# Patient Record
Sex: Female | Born: 1990
Health system: Southern US, Community
[De-identification: ages and names within clinical notes are randomized; demographics above are authoritative.]

## PROBLEM LIST (undated history)

## (undated) DIAGNOSIS — R112 Nausea with vomiting, unspecified: Secondary | ICD-10-CM

## (undated) DIAGNOSIS — R011 Cardiac murmur, unspecified: Secondary | ICD-10-CM

## (undated) DIAGNOSIS — J189 Pneumonia, unspecified organism: Secondary | ICD-10-CM

## (undated) DIAGNOSIS — J45909 Unspecified asthma, uncomplicated: Secondary | ICD-10-CM

## (undated) DIAGNOSIS — Z9889 Other specified postprocedural states: Secondary | ICD-10-CM

## (undated) DIAGNOSIS — N83209 Unspecified ovarian cyst, unspecified side: Secondary | ICD-10-CM

## (undated) HISTORY — DX: Cardiac murmur, unspecified: R01.1

## (undated) HISTORY — PX: APPENDECTOMY: SHX54

---

## 2016-03-01 ENCOUNTER — Ambulatory Visit (INDEPENDENT_AMBULATORY_CARE_PROVIDER_SITE_OTHER): Payer: 59 | Admitting: Family Medicine

## 2016-03-01 ENCOUNTER — Encounter: Payer: Self-pay | Admitting: Family Medicine

## 2016-03-01 VITALS — BP 140/90 | HR 72 | Temp 98.6°F | Ht 62.5 in | Wt 123.1 lb

## 2016-03-01 DIAGNOSIS — Z0001 Encounter for general adult medical examination with abnormal findings: Secondary | ICD-10-CM | POA: Diagnosis not present

## 2016-03-01 DIAGNOSIS — L659 Nonscarring hair loss, unspecified: Secondary | ICD-10-CM | POA: Diagnosis not present

## 2016-03-01 LAB — POC URINALSYSI DIPSTICK (AUTOMATED)
BILIRUBIN UA: NEGATIVE
GLUCOSE UA: NEGATIVE
Leukocytes, UA: NEGATIVE
Nitrite, UA: NEGATIVE
Protein, UA: NEGATIVE
RBC UA: NEGATIVE
SPEC GRAV UA: 1.02
Urobilinogen, UA: 0.2
pH, UA: 5.5

## 2016-03-01 NOTE — Patient Instructions (Signed)
It was a pleasure meeting you today! Please obtain prior medical records at your convenience as we discussed. Also, follow up will be determined after lab results are reviewed.  Health Maintenance, Female Adopting a healthy lifestyle and getting preventive care can go a long way to promote health and wellness. Talk with your health care provider about what schedule of regular examinations is right for you. This is a good chance for you to check in with your provider about disease prevention and staying healthy. In between checkups, there are plenty of things you can do on your own. Experts have done a lot of research about which lifestyle changes and preventive measures are most likely to keep you healthy. Ask your health care provider for more information. WEIGHT AND DIET  Eat a healthy diet  Be sure to include plenty of vegetables, fruits, low-fat dairy products, and lean protein.  Do not eat a lot of foods high in solid fats, added sugars, or salt.  Get regular exercise. This is one of the most important things you can do for your health.  Most adults should exercise for at least 150 minutes each week. The exercise should increase your heart rate and make you sweat (moderate-intensity exercise).  Most adults should also do strengthening exercises at least twice a week. This is in addition to the moderate-intensity exercise.  Maintain a healthy weight  Body mass index (BMI) is a measurement that can be used to identify possible weight problems. It estimates body fat based on height and weight. Your health care provider can help determine your BMI and help you achieve or maintain a healthy weight.  For females 11 years of age and older:   A BMI below 18.5 is considered underweight.  A BMI of 18.5 to 24.9 is normal.  A BMI of 25 to 29.9 is considered overweight.  A BMI of 30 and above is considered obese.  Watch levels of cholesterol and blood lipids  You should start having your  blood tested for lipids and cholesterol at 27 years of age, then have this test every 5 years.  You may need to have your cholesterol levels checked more often if:  Your lipid or cholesterol levels are high.  You are older than 27 years of age.  You are at high risk for heart disease.  CANCER SCREENING   Lung Cancer  Lung cancer screening is recommended for adults 69-47 years old who are at high risk for lung cancer because of a history of smoking.  A yearly low-dose CT scan of the lungs is recommended for people who:  Currently smoke.  Have quit within the past 15 years.  Have at least a 30-pack-year history of smoking. A pack year is smoking an average of one pack of cigarettes a day for 1 year.  Yearly screening should continue until it has been 15 years since you quit.  Yearly screening should stop if you develop a health problem that would prevent you from having lung cancer treatment.  Breast Cancer  Practice breast self-awareness. This means understanding how your breasts normally appear and feel.  It also means doing regular breast self-exams. Let your health care provider know about any changes, no matter how small.  If you are in your 20s or 30s, you should have a clinical breast exam (CBE) by a health care provider every 1-3 years as part of a regular health exam.  If you are 70 or older, have a CBE every year. Also  consider having a breast X-ray (mammogram) every year.  If you have a family history of breast cancer, talk to your health care provider about genetic screening.  If you are at high risk for breast cancer, talk to your health care provider about having an MRI and a mammogram every year.  Breast cancer gene (BRCA) assessment is recommended for women who have family members with BRCA-related cancers. BRCA-related cancers include:  Breast.  Ovarian.  Tubal.  Peritoneal cancers.  Results of the assessment will determine the need for genetic  counseling and BRCA1 and BRCA2 testing. Cervical Cancer Your health care provider may recommend that you be screened regularly for cancer of the pelvic organs (ovaries, uterus, and vagina). This screening involves a pelvic examination, including checking for microscopic changes to the surface of your cervix (Pap test). You may be encouraged to have this screening done every 3 years, beginning at age 85.  For women ages 66-65, health care providers may recommend pelvic exams and Pap testing every 3 years, or they may recommend the Pap and pelvic exam, combined with testing for human papilloma virus (HPV), every 5 years. Some types of HPV increase your risk of cervical cancer. Testing for HPV may also be done on women of any age with unclear Pap test results.  Other health care providers may not recommend any screening for nonpregnant women who are considered low risk for pelvic cancer and who do not have symptoms. Ask your health care provider if a screening pelvic exam is right for you.  If you have had past treatment for cervical cancer or a condition that could lead to cancer, you need Pap tests and screening for cancer for at least 20 years after your treatment. If Pap tests have been discontinued, your risk factors (such as having a new sexual partner) need to be reassessed to determine if screening should resume. Some women have medical problems that increase the chance of getting cervical cancer. In these cases, your health care provider may recommend more frequent screening and Pap tests. Colorectal Cancer  This type of cancer can be detected and often prevented.  Routine colorectal cancer screening usually begins at 27 years of age and continues through 27 years of age.  Your health care provider may recommend screening at an earlier age if you have risk factors for colon cancer.  Your health care provider may also recommend using home test kits to check for hidden blood in the stool.  A  small camera at the end of a tube can be used to examine your colon directly (sigmoidoscopy or colonoscopy). This is done to check for the earliest forms of colorectal cancer.  Routine screening usually begins at age 41.  Direct examination of the colon should be repeated every 5-10 years through 27 years of age. However, you may need to be screened more often if early forms of precancerous polyps or small growths are found. Skin Cancer  Check your skin from head to toe regularly.  Tell your health care provider about any new moles or changes in moles, especially if there is a change in a mole's shape or color.  Also tell your health care provider if you have a mole that is larger than the size of a pencil eraser.  Always use sunscreen. Apply sunscreen liberally and repeatedly throughout the day.  Protect yourself by wearing long sleeves, pants, a wide-brimmed hat, and sunglasses whenever you are outside. HEART DISEASE, DIABETES, AND HIGH BLOOD PRESSURE   High  blood pressure causes heart disease and increases the risk of stroke. High blood pressure is more likely to develop in:  People who have blood pressure in the high end of the normal range (130-139/85-89 mm Hg).  People who are overweight or obese.  People who are African American.  If you are 46-75 years of age, have your blood pressure checked every 3-5 years. If you are 37 years of age or older, have your blood pressure checked every year. You should have your blood pressure measured twice--once when you are at a hospital or clinic, and once when you are not at a hospital or clinic. Record the average of the two measurements. To check your blood pressure when you are not at a hospital or clinic, you can use:  An automated blood pressure machine at a pharmacy.  A home blood pressure monitor.  If you are between 54 years and 18 years old, ask your health care provider if you should take aspirin to prevent strokes.  Have  regular diabetes screenings. This involves taking a blood sample to check your fasting blood sugar level.  If you are at a normal weight and have a low risk for diabetes, have this test once every three years after 27 years of age.  If you are overweight and have a high risk for diabetes, consider being tested at a younger age or more often. PREVENTING INFECTION  Hepatitis B  If you have a higher risk for hepatitis B, you should be screened for this virus. You are considered at high risk for hepatitis B if:  You were born in a country where hepatitis B is common. Ask your health care provider which countries are considered high risk.  Your parents were born in a high-risk country, and you have not been immunized against hepatitis B (hepatitis B vaccine).  You have HIV or AIDS.  You use needles to inject street drugs.  You live with someone who has hepatitis B.  You have had sex with someone who has hepatitis B.  You get hemodialysis treatment.  You take certain medicines for conditions, including cancer, organ transplantation, and autoimmune conditions. Hepatitis C  Blood testing is recommended for:  Everyone born from [birth year] through 1967.  Anyone with known risk factors for hepatitis C. Sexually transmitted infections (STIs)  You should be screened for sexually transmitted infections (STIs) including gonorrhea and chlamydia if:  You are sexually active and are younger than 27 years of age.  You are older than 27 years of age and your health care provider tells you that you are at risk for this type of infection.  Your sexual activity has changed since you were last screened and you are at an increased risk for chlamydia or gonorrhea. Ask your health care provider if you are at risk.  If you do not have HIV, but are at risk, it may be recommended that you take a prescription medicine daily to prevent HIV infection. This is called pre-exposure prophylaxis (PrEP). You are  considered at risk if:  You are sexually active and do not regularly use condoms or know the HIV status of your partner(s).  You take drugs by injection.  You are sexually active with a partner who has HIV. Talk with your health care provider about whether you are at high risk of being infected with HIV. If you choose to begin PrEP, you should first be tested for HIV. You should then be tested every 3 months for as long  as you are taking PrEP.  PREGNANCY   If you are premenopausal and you may become pregnant, ask your health care provider about preconception counseling.  If you may become pregnant, take 400 to 800 micrograms (mcg) of folic acid every day.  If you want to prevent pregnancy, talk to your health care provider about birth control (contraception). OSTEOPOROSIS AND MENOPAUSE   Osteoporosis is a disease in which the bones lose minerals and strength with aging. This can result in serious bone fractures. Your risk for osteoporosis can be identified using a bone density scan.  If you are 84 years of age or older, or if you are at risk for osteoporosis and fractures, ask your health care provider if you should be screened.  Ask your health care provider whether you should take a calcium or vitamin D supplement to lower your risk for osteoporosis.  Menopause may have certain physical symptoms and risks.  Hormone replacement therapy may reduce some of these symptoms and risks. Talk to your health care provider about whether hormone replacement therapy is right for you.  HOME CARE INSTRUCTIONS   Schedule regular health, dental, and eye exams.  Stay current with your immunizations.   Do not use any tobacco products including cigarettes, chewing tobacco, or electronic cigarettes.  If you are pregnant, do not drink alcohol.  If you are breastfeeding, limit how much and how often you drink alcohol.  Limit alcohol intake to no more than 1 drink per day for nonpregnant women. One  drink equals 12 ounces of beer, 5 ounces of wine, or 1 ounces of hard liquor.  Do not use street drugs.  Do not share needles.  Ask your health care provider for help if you need support or information about quitting drugs.  Tell your health care provider if you often feel depressed.  Tell your health care provider if you have ever been abused or do not feel safe at home.   This information is not intended to replace advice given to you by your health care provider. Make sure you discuss any questions you have with your health care provider.   Document Released: 12/21/2010 Document Revised: 06/28/2014 Document Reviewed: 05/09/2013 Elsevier Interactive Patient Education [employer].

## 2016-03-01 NOTE — Progress Notes (Signed)
Pre visit review using our clinic review tool, if applicable. No additional management support is needed unless otherwise documented below in the visit note. 

## 2016-03-01 NOTE — Progress Notes (Addendum)
Patient ID: Lori Miranda, female   DOB: [birth year], 25 y.o.   MRN: [id number]  Patient presents to clinic today to establish care and for routine wellness care. She is a nonsmoker and is currently UTD with her gynecological care. She recently relocated here from [facility]. Last gynecology visit 07/2015 was normal.  Diet consists of lean meats and vegetables. Water intake about 3 glasses. One cup of coffee in the morning.   Exercise: Walks at work and walks dog at home every night.    Hair thinning:  Patient has been seen by dermatology for hair thinning that has been present since 2012. She reports that she will obtain records from [facility] for review as she is currently not on any medications for this purpose. She reports that she is not experiencing "hair loss" but associated thinning of hair with prior birth control therapies.She discontinued depo provera as she was concerned this may have contributed to her hair loss and she denies any additional thinning of hair after stopping depo provera. Although she does not report any additional hair loss; she notes that her hair is thin. Mother has thin hair also and patient is unsure when her thinning started.  She also is UTD with her gynecology care and as of 07/2015 stated that her testosterone was normal and DHEA was mildly elevated. She denies any pain, tenderness, pruritus, burning, new medications, hair products, or change in diet.  She is experiencing regular menstrual cycles.   Health Maintenance: Dental --Every 6 months Vision -- Yearly; wears contacts; UTD Immunizations --Will obtain at work; declined in office today Colonoscopy --Not needed Mammogram --Not needed PAP -- 07/2015 Bone Density -- Not needed  Past Medical History:  Diagnosis Date  . Heart murmur      Social History   Social History  . Marital status: Single    Spouse name: N/A  . Number of children: N/A  . Years of education: N/A   Occupational History  . Not on file.    Social History Main Topics  . Smoking status: Never Smoker  . Smokeless tobacco: Never Used  . Alcohol use 2.4 oz/week    4 Cans of beer per week  . Drug use: No  . Sexual activity: Yes    Birth control/ protection: Condom   Other Topics Concern  . Not on file   Social History Narrative  . No narrative on file    Past Surgical History:  Procedure Laterality Date  . APPENDECTOMY      Family History  Problem Relation Age of Onset  . Hypertension Mother   . Diabetes Mother     Allergies not on file  No current outpatient prescriptions on file prior to visit.   No current facility-administered medications on file prior to visit.     BP 140/90 (BP Location: Right Arm, Patient Position: Sitting, Cuff Size: Normal)   Pulse 72   Temp 98.6 F (37 C) (Oral)   Ht 5' 2.5" (1.588 m)   Wt 123 lb 1.6 oz (55.8 kg)   LMP 02/01/2016 (Exact Date)   SpO2 99%   BMI 22.16 kg/m     ROS  Pulse 72   Temp 98.6 F (37 C) (Oral)   Ht 5' 2.5" (1.588 m)   Wt 123 lb 1.6 oz (55.8 kg)   LMP 02/01/2016 (Exact Date)   SpO2 99%   BMI 22.16 kg/m  Constitutional: No fever, chills, significant weight change, fatigue, weakness or night sweats Eyes: No redness,  discharge, pain, blurred vision, double vision, or loss of vision ENT/mouth: No nasal congestion, postnasal drainage,epistaxis, purulent discharge, earache, hearing loss, tinnitus ,sore throat , dental pain, or hoarseness   Cardiovascular: no chest pain, palpitations, racing, irregular rhythm, syncope, nausea, sweating, claudication, or edema  Respiratory: No cough, sputum production,hemoptysis,  dyspnea, paroxysmal nocturnal dyspnea, pleuritic chest pain, significant snoring, or  apnea    Gastrointestinal: No heartburn,dysphagia, nausea and vomiting,ominal pain, change in bowels, anorexia, diarrhea, significant constipation, rectal bleeding, melena,  stool incontinence or jaundice Genitourinary: No dysuria,hematuria, pyuria,  frequency, urgency,  incontinence, nocturia, dark urine or flank pain Musculoskeletal: No myalgias or muscle cramping, joint stiffness, joint swelling, joint color change, weakness, or cyanosis Dermatologic: No rash, pruritus, urticaria, or change in color or temperature of skin.  Neurologic: No headache, vertigo, limb weakness, tremor, gait disturbance, seizures, memory loss, numbness or tingling Psychiatric: No significant anxiety or depression, anhedonia, panic attacks, insomnia, or anorexia Endocrine: No change in skin/ nails, excessive thirst, excessive hunger, excessive urination, or unexplained fatigue. Hair thinning Hematologic/lymphatic: No bruising, lymphadenopathy,or  abnormal clotting Allergy/immunology: No itchy/ watery eyes, abnormal sneezing, rhinitis, urticaria ,or angioedema  Physical Exam Physical Exam  Constitutional: She is oriented to person, place, and time. She appears well-developed and well-nourished. No distress.  HENT:  Head: Normocephalic and atraumatic.  Right Ear: Tympanic membrane and ear canal normal.  Left Ear: Tympanic membrane and ear canal normal.  Mouth/Throat: Oropharynx is clear and moist.  Eyes: Pupils are equal, round, and reactive to light. No scleral icterus.  Neck: Normal range of motion. No thyromegaly present.  Cardiovascular: Normal rate and regular rhythm.   No murmur heard. Pulmonary/Chest: Effort normal and breath sounds normal. No respiratory distress. He has no wheezes. She has no rales. She exhibits no tenderness.  Abdominal: Soft. Bowel sounds are normal. She exhibits no distension and no mass. There is no tenderness. There is no rebound and no guarding.  Musculoskeletal: She exhibits no edema.  Lymphadenopathy:    She has no cervical adenopathy.  Neurological: She is alert and oriented to person, place, and time. She has normal patellar reflexes. She exhibits normal muscle tone. Coordination normal.  Skin: Skin is warm and dry.  Negative hair pull test. Hair is distributed evenly and she is wearing her hair in a ponytail.  Psychiatric: She has a normal mood and affect. Her behavior is normal. Judgment and thought content normal.  Gynecological exam:  Deferred. She  is UTD and reports PAP and breast exam normal; She will request records from [facility].     Assessment/Plan:  25 y.o. female presenting for annual physical.  Health Maintenance counseling: 1. Anticipatory guidance: Patient counseled regarding regular dental exams, eye exams, wearing seatbelts.  2. Risk factor reduction:  Advised patient of need for regular exercise and diet rich and fruits and vegetables to reduce risk of heart attack and stroke.  3. Immunizations/screenings/ancillary studies. She declined influenza vaccine today. She will obtain in her workplace next week. Release of medical records signed today to obtain UTD health screening documentation.  Health Maintenance Due  Topic Date Due  . HIV Screening  07/20/2003  . TETANUS/TDAP  07/20/2007  . PAP SMEAR  07/19/2009  . INFLUENZA VACCINE  01/20/2016   4. Cervical cancer screening- UTD; Completed 07/2015; release for records completed today. 5. Breast cancer screening-  Completed 07/2015; release for records completed today.  1. Encounter for general adult medical examination with abnormal findings Overall well exam. Will obtain lab work with the  exception of Lipid level as patient is not fasting today. She is requesting to obtain lab work today and will follow up for a lipid level at a later time when she is fasting. Recommended regular exercise regimen with 150 minutes/week as a goal.    - CBC with Differential/Platelet - Basic metabolic panel - Hepatic function panel - Urinalysis  2. Hair thinning Patient history of hair thinning with Depo provera and prior hormonal studies may indicate that thinning of hair is an adverse effect of Depo. Obtain lab work today and patient stated she will obtain  gynecological records for review. Negative hair pull test and report that she is not aware of further hair loss, and family history of thinning hair are noted. After review of records and if symptom continues to occur, will consider obtaining lab work for [facility] and testosterone levels or will consider referral to endocrinology.  - TSH  Roddie Mc, FNP-C

## 2016-03-01 NOTE — Addendum Note (Signed)
Addended by: Baldwin CrownJOHNSON, Chancelor Hardrick D on: 03/01/2016 05:22 PM   Modules accepted: Orders

## 2016-03-02 LAB — CBC WITH DIFFERENTIAL/PLATELET
BASOS ABS: 0 10*3/uL (ref 0.0–0.1)
BASOS PCT: 0.4 % (ref 0.0–3.0)
Eosinophils Absolute: 0 10*3/uL (ref 0.0–0.7)
Eosinophils Relative: 0.7 % (ref 0.0–5.0)
HEMATOCRIT: 38 % (ref 36.0–46.0)
Hemoglobin: 13.1 g/dL (ref 12.0–15.0)
LYMPHS PCT: 27.7 % (ref 12.0–46.0)
Lymphs Abs: 1.2 10*3/uL (ref 0.7–4.0)
MCHC: 34.6 g/dL (ref 30.0–36.0)
MCV: 91.8 fl (ref 78.0–100.0)
MONOS PCT: 6.5 % (ref 3.0–12.0)
Monocytes Absolute: 0.3 10*3/uL (ref 0.1–1.0)
NEUTROS ABS: 2.9 10*3/uL (ref 1.4–7.7)
Neutrophils Relative %: 64.7 % (ref 43.0–77.0)
PLATELETS: 257 10*3/uL (ref 150.0–400.0)
RBC: 4.14 Mil/uL (ref 3.87–5.11)
RDW: 12.9 % (ref 11.5–15.5)
WBC: 4.4 10*3/uL (ref 4.0–10.5)

## 2016-03-02 LAB — BASIC METABOLIC PANEL
BUN: 16 mg/dL (ref 6–23)
CHLORIDE: 102 meq/L (ref 96–112)
CO2: 28 mEq/L (ref 19–32)
Calcium: 9.5 mg/dL (ref 8.4–10.5)
Creatinine, Ser: 0.7 mg/dL (ref 0.40–1.20)
GFR: 106.2 mL/min (ref >60.00)
Glucose, Bld: 114 mg/dL — ABNORMAL HIGH (ref 70–99)
Potassium: 3.9 mEq/L (ref 3.5–5.1)
Sodium: 137 mEq/L (ref 135–145)

## 2016-03-02 LAB — HEPATIC FUNCTION PANEL
ALT: 13 U/L (ref 0–35)
AST: 18 U/L (ref 0–37)
Albumin: 4.8 g/dL (ref 3.5–5.2)
Alkaline Phosphatase: 38 U/L — ABNORMAL LOW (ref 39–117)
BILIRUBIN TOTAL: 0.4 mg/dL (ref 0.2–1.2)
Bilirubin, Direct: 0.1 mg/dL (ref 0.0–0.3)
TOTAL PROTEIN: 7.3 g/dL (ref 6.0–8.3)

## 2016-03-02 LAB — TSH: TSH: 2.31 u[IU]/mL (ref 0.35–4.50)

## 2016-03-03 ENCOUNTER — Telehealth: Payer: Self-pay | Admitting: Family Medicine

## 2016-03-03 NOTE — Telephone Encounter (Signed)
Pt is returning sierra call °

## 2016-03-03 NOTE — Telephone Encounter (Signed)
Spoke with pt

## 2016-03-04 ENCOUNTER — Telehealth: Payer: Self-pay | Admitting: Family Medicine

## 2016-03-04 ENCOUNTER — Other Ambulatory Visit: Payer: Self-pay | Admitting: Family Medicine

## 2016-03-04 ENCOUNTER — Other Ambulatory Visit: Payer: Self-pay | Admitting: Emergency Medicine

## 2016-03-04 DIAGNOSIS — L659 Nonscarring hair loss, unspecified: Secondary | ICD-10-CM

## 2016-03-04 DIAGNOSIS — Z0001 Encounter for general adult medical examination with abnormal findings: Secondary | ICD-10-CM

## 2016-03-04 NOTE — Progress Notes (Signed)
Hair thinning and loss is noted. Patient is interested in a work up including lab studies. [facility] and treatment.

## 2016-03-04 NOTE — Telephone Encounter (Signed)
Pt is calling wanting to have labs done today or tomorrow that was previously placed but was taken out.  She would like to have them a week before she goes to the dermatologist so that they will know how to treat her and so that she will be able to get her birth control pills.

## 2016-03-04 NOTE — Telephone Encounter (Signed)
Labs have been placed pt is aware.

## 2016-03-04 NOTE — Telephone Encounter (Signed)
Please Advise

## 2016-03-04 NOTE — Progress Notes (Signed)
Spoke with patient by phone and she has agreed to a referral to dermatology for further evaluation and treatment including lab work associated with hair thinning. Oral contraceptives will be provided following the lab studies by dermatology.

## 2016-03-04 NOTE — Telephone Encounter (Signed)
° °  Pt call to say she saw Raynelle FanningJulie on this week and was told she would be pyutting in labs for her.She call today to ask if those labs were added and they are not . Pt would like to have these labs done since her insurance will pay

## 2016-03-04 NOTE — Progress Notes (Signed)
Spoke with patient via phone and discussed that obtaining a DHEA-s and testosterone should be completed by endocrinology as potential hormonal imbalances and treatment will be guided by a specialist and not in primary care scope. She has [facility] [place] [facility] and was under the care of a dermatologist and endocrinologist and is interested in establishing care with both specialities. Referral placed to endocrinology and dermatology (hair loss clinic) as requested. Advised patient to follow up regarding oral contraceptives at her convenience. She states that she will follow up after her work up with endocrinology.

## 2016-03-10 DIAGNOSIS — H5213 Myopia, bilateral: Secondary | ICD-10-CM | POA: Diagnosis not present

## 2016-03-10 DIAGNOSIS — H52223 Regular astigmatism, bilateral: Secondary | ICD-10-CM | POA: Diagnosis not present

## 2016-03-11 ENCOUNTER — Telehealth: Payer: Self-pay | Admitting: Family Medicine

## 2016-03-11 NOTE — Telephone Encounter (Signed)
Pt needs an appt after 3 pm with endocrinologist and [facility] can not accommodate this pt per pt. Pt would like to see another endo

## 2016-03-23 NOTE — Telephone Encounter (Signed)
Pt calling to check the status of the 2nd referral to the endocrinologist.

## 2016-04-21 ENCOUNTER — Encounter: Payer: Self-pay | Admitting: Endocrinology

## 2016-04-21 ENCOUNTER — Ambulatory Visit (INDEPENDENT_AMBULATORY_CARE_PROVIDER_SITE_OTHER): Payer: 59 | Admitting: Endocrinology

## 2016-04-21 VITALS — BP 130/82 | HR 63 | Temp 98.3°F | Resp 16 | Ht 63.5 in | Wt 124.4 lb

## 2016-04-21 DIAGNOSIS — L658 Other specified nonscarring hair loss: Secondary | ICD-10-CM

## 2016-04-21 DIAGNOSIS — R7989 Other specified abnormal findings of blood chemistry: Secondary | ICD-10-CM

## 2016-04-21 NOTE — Progress Notes (Signed)
Patient ID: Lori Miranda, female   DOB: Oct 30, 1990, 25 y.o.   MRN: [id number]            Referring physician: Lendon Ka  Chief complaint: Hair loss  History of Present Illness:   She has noticed thinning of hair here since about 2012 This has been to a variable extent in the past In 2012 she was on Depo-Provera shots which she thinks her hair loss was not different with stopping the Depo-Provera She has not had any associated change in her menstrual cycles She usually has been on some form of birth control products In 2014 she was evaluated by an endocrinologist, had been off birth control pills for 3 months for this evaluation and had the following labs:  Total testosterone = 53, normal <76.  DHEAS 442, high  Over the last year she thinks her hair thinning has been somewhat worse but she has had no treatment for this Her menstrual cycles are recently coming every 5 weeks or so She has been off the birth control pill for a year and a half  Currently she does not notice any acne, facial hair or abnormal body hair.  No change in libido No recent weight change    Past Medical History:  Diagnosis Date  . Heart murmur     Past Surgical History:  Procedure Laterality Date  . APPENDECTOMY      Family History  Problem Relation Age of Onset  . Hypertension Mother   . Diabetes Mother     Social History:  reports that she has never smoked. She has never used smokeless tobacco. She reports that she drinks about 2.4 oz of alcohol per week . She reports that she does not use drugs.  Allergies: Not on File    Medication List    as of 04/21/2016  2:30 PM   You have not been prescribed any medications.     LABS:  No visits with results within 1 Week(s) from this visit.  Latest known visit with results is:  Office Visit on 03/01/2016  Component Date Value Ref Range Status  . WBC 03/02/2016 4.4  4.0 - 10.5 K/uL Final  . RBC 03/02/2016 4.14  3.87 - 5.11 Mil/uL Final  .  Hemoglobin 03/02/2016 13.1  12.0 - 15.0 g/dL Final  . HCT 40/98/1191 38.0  36.0 - 46.0 % Final  . MCV 03/02/2016 91.8  78.0 - 100.0 fl Final  . MCHC 03/02/2016 34.6  30.0 - 36.0 g/dL Final  . RDW 47/82/9562 12.9  11.5 - 15.5 % Final  . Platelets 03/02/2016 257.0  150.0 - 400.0 K/uL Final  . Neutrophils Relative % 03/02/2016 64.7  43.0 - 77.0 % Final  . Lymphocytes Relative 03/02/2016 27.7  12.0 - 46.0 % Final  . Monocytes Relative 03/02/2016 6.5  3.0 - 12.0 % Final  . Eosinophils Relative 03/02/2016 0.7  0.0 - 5.0 % Final  . Basophils Relative 03/02/2016 0.4  0.0 - 3.0 % Final  . Neutro Abs 03/02/2016 2.9  1.4 - 7.7 K/uL Final  . Lymphs Abs 03/02/2016 1.2  0.7 - 4.0 K/uL Final  . Monocytes Absolute 03/02/2016 0.3  0.1 - 1.0 K/uL Final  . Eosinophils Absolute 03/02/2016 0.0  0.0 - 0.7 K/uL Final  . Basophils Absolute 03/02/2016 0.0  0.0 - 0.1 K/uL Final  . Sodium 03/02/2016 137  135 - 145 mEq/L Final  . Potassium 03/02/2016 3.9  3.5 - 5.1 mEq/L Final  . Chloride 03/02/2016 102  96 - 112 mEq/L Final  . CO2 03/02/2016 28  19 - 32 mEq/L Final  . Glucose, Bld 03/02/2016 114* 70 - 99 mg/dL Final  . BUN 06/30/160109/05/2016 16  6 - 23 mg/dL Final  . Creatinine, Ser 03/02/2016 0.70  0.40 - 1.20 mg/dL Final  . Calcium 09/32/355709/05/2016 9.5  8.4 - 10.5 mg/dL Final  . GFR 32/20/254209/05/2016 106.20  >60.00 mL/min Final  . Total Bilirubin 03/02/2016 0.4  0.2 - 1.2 mg/dL Final  . Bilirubin, Direct 03/02/2016 0.1  0.0 - 0.3 mg/dL Final  . Alkaline Phosphatase 03/02/2016 38* 39 - 117 U/L Final  . AST 03/02/2016 18  0 - 37 U/L Final  . ALT 03/02/2016 13  0 - 35 U/L Final  . Total Protein 03/02/2016 7.3  6.0 - 8.3 g/dL Final  . Albumin 70/62/376209/05/2016 4.8  3.5 - 5.2 g/dL Final  . TSH 83/15/176109/05/2016 2.31  0.35 - 4.50 uIU/mL Final  . Color, UA 03/01/2016 yellow   Final  . Clarity, UA 03/01/2016 clear   Final  . Glucose, UA 03/01/2016 n   Final  . Bilirubin, UA 03/01/2016 n   Final  . Ketones, UA 03/01/2016 1+   Final  . Spec Grav,  UA 03/01/2016 1.020   Final  . Blood, UA 03/01/2016 n   Final  . pH, UA 03/01/2016 5.5   Final  . Protein, UA 03/01/2016 n   Final  . Urobilinogen, UA 03/01/2016 0.2   Final  . Nitrite, UA 03/01/2016 n   Final  . Leukocytes, UA 03/01/2016 Negative  Negative Final        Review of Systems  Constitutional: Negative for weight loss.  Respiratory: Negative for shortness of breath.   Cardiovascular: Negative for leg swelling.  Gastrointestinal: Negative for abdominal pain.  Endocrine: Positive for fatigue.  Neurological: Negative for weakness.  Psychiatric/Behavioral: Negative for insomnia.   Last menstrual cycle: 10/21  PHYSICAL EXAM:  BP 130/82 (BP Location: Left Arm, Patient Position: Sitting, Cuff Size: Normal)   Pulse 63   Temp 98.3 F (36.8 C) (Oral)   Resp 16   Ht 5' 3.5" (1.613 m)   Wt 124 lb 6.4 oz (56.4 kg)   SpO2 98%   BMI 21.69 kg/m   Standing blood pressure 130/84  GENERAL:  No pallor, clubbing, lymphadenopathy or edema.    Skin:  no rash or pigmentation.  No hirsutism or acne Her scalp shows diffuse thinning of her hair on the frontal and central portion of the scalp but no receding of the frontal hairline.  Has a little hair loss also towards the sides of her scalp.  EYES:  Externally normal.   ENT: Oral mucosa and tongue normal.  THYROID:  Not palpable.  HEART:  Normal  S1 and S2; no murmur or click.  CHEST:  Normal shape.  Lungs: Vescicular breath sounds heard equally.  No crepitations/ wheeze.  ABDOMEN:  No distention.  Liver and spleen not palpable.  No other mass or tenderness.  NEUROLOGICAL: .Reflexes are bilaterally normal at biceps  JOINTS:  Normal.  Spine appears normal   ASSESSMENT:    Female pattern hair loss.  She has no signs of hyperandrogenism or abnormal menstrual cycles, currently off birth control pills.   Unlikely that she has an endocrinological issue although previously has had a high DHEAS level of unclear significance.    PLAN:    Repeat fasting testosterone and DHEAS levels  Further evaluation will depend on Lab results  Discussed that  most likely she will need to be treated by a dermatologist, usually treatments are minoxidil and other topical treatments   Consultation note sent to the referring physician  [facility] Center[facility]UMAR,Avarie Tavano 04/21/2016, 2:30 PM

## 2016-04-22 ENCOUNTER — Other Ambulatory Visit: Payer: 59

## 2016-04-22 DIAGNOSIS — E348 Other specified endocrine disorders: Secondary | ICD-10-CM | POA: Diagnosis not present

## 2016-04-22 DIAGNOSIS — L658 Other specified nonscarring hair loss: Secondary | ICD-10-CM

## 2016-04-25 LAB — 17-HYDROXYPROGESTERONE: 17-Hydroxyprogesterone: 30 ng/dL

## 2016-04-25 LAB — DHEA-SULFATE: DHEA SO4: 479.8 ug/dL — AB (ref 84.8–378.0)

## 2016-04-25 LAB — TESTOSTERONE, FREE, TOTAL, SHBG
SEX HORMONE BINDING: 64.7 nmol/L (ref 24.6–122.0)
TESTOSTERONE FREE: 7.6 pg/mL — AB (ref 0.0–4.2)
TESTOSTERONE: 52 ng/dL — AB (ref 8–48)

## 2016-04-26 NOTE — Progress Notes (Signed)
Please let patient know that both of the testosterone and DHEAs are high, need to do a dexamethasone suppression test to analyze whether this is from adrenal glands or ovary.   Will need to discuss with patient on the phone, please connect

## 2016-04-27 ENCOUNTER — Telehealth: Payer: Self-pay | Admitting: Endocrinology

## 2016-04-27 ENCOUNTER — Encounter: Payer: Self-pay | Admitting: Endocrinology

## 2016-04-27 ENCOUNTER — Other Ambulatory Visit: Payer: Self-pay | Admitting: Endocrinology

## 2016-04-27 DIAGNOSIS — E281 Androgen excess: Secondary | ICD-10-CM

## 2016-04-27 DIAGNOSIS — E348 Other specified endocrine disorders: Secondary | ICD-10-CM

## 2016-04-27 MED ORDER — DEXAMETHASONE 0.5 MG PO TABS: 0.5000 mg | ORAL_TABLET | Freq: Four times a day (QID) | ORAL | 0 refills | Status: DC

## 2016-04-27 NOTE — Telephone Encounter (Signed)
Patient stated she got a call about a procedure, and she is not aware of that . Please advise

## 2016-04-28 MED FILL — DEXAMETHASONE 0.5 MG TABLET: 0.5 | 2 days supply | Qty: 8 | Fill #0

## 2016-04-28 NOTE — Progress Notes (Signed)
Lori Miranda will not be here Friday, [facility]

## 2016-04-30 ENCOUNTER — Other Ambulatory Visit (HOSPITAL_COMMUNITY)
Admission: RE | Admit: 2016-04-30 | Discharge: 2016-04-30 | Disposition: A | Discharge status: Home / Self Care | Payer: 59 | Source: Ambulatory Visit | Attending: Endocrinology | Admitting: Endocrinology

## 2016-04-30 DIAGNOSIS — E348 Other specified endocrine disorders: Secondary | ICD-10-CM | POA: Diagnosis not present

## 2016-05-01 LAB — TESTOSTERONE,FREE AND TOTAL
TESTOSTERONE FREE: 0.6 pg/mL (ref 0.0–4.2)
TESTOSTERONE: 23 ng/dL (ref 8–48)

## 2016-05-01 LAB — DHEA-SULFATE: DHEA-SO4: 138.7 ug/dL (ref 84.8–378.0)

## 2016-05-01 LAB — SEX HORMONE BINDING GLOBULIN: SEX HORMONE BINDING: 71.4 nmol/L (ref 24.6–122.0)

## 2016-05-04 ENCOUNTER — Telehealth: Payer: Self-pay | Admitting: Endocrinology

## 2016-05-04 ENCOUNTER — Other Ambulatory Visit: Payer: Self-pay

## 2016-05-04 MED ORDER — PREDNISONE 5 MG PO TABS: 5.0000 mg | ORAL_TABLET | Freq: Every day | ORAL | 0 refills | Status: DC

## 2016-05-04 MED FILL — predniSONE 5 MG TABS: 5 | 30 days supply | Qty: 30 | Fill #0

## 2016-05-04 NOTE — Telephone Encounter (Signed)
See lab note please call in prednisone to pt

## 2016-05-05 ENCOUNTER — Encounter: Payer: Self-pay | Admitting: Endocrinology

## 2016-05-05 NOTE — Telephone Encounter (Signed)
Ordered 05/04/16 

## 2016-05-10 ENCOUNTER — Encounter: Payer: Self-pay | Admitting: Family Medicine

## 2016-05-24 NOTE — Telephone Encounter (Signed)
Put in wrong code for lab results. Please advise

## 2016-05-25 DIAGNOSIS — Z3009 Encounter for other general counseling and advice on contraception: Secondary | ICD-10-CM | POA: Diagnosis not present

## 2016-06-02 ENCOUNTER — Encounter: Payer: Self-pay | Admitting: Endocrinology

## 2016-06-03 NOTE — Telephone Encounter (Signed)
Pt needs refill on prednisone called to outpt pharmacy

## 2016-06-06 ENCOUNTER — Other Ambulatory Visit: Payer: Self-pay | Admitting: Endocrinology

## 2016-06-06 DIAGNOSIS — E281 Androgen excess: Secondary | ICD-10-CM | POA: Insufficient documentation

## 2016-06-06 DIAGNOSIS — E348 Other specified endocrine disorders: Secondary | ICD-10-CM

## 2016-06-07 ENCOUNTER — Telehealth: Payer: Self-pay | Admitting: Endocrinology

## 2016-06-07 NOTE — Telephone Encounter (Signed)
Pt called in last week needing her Prednisone refill, please submit to the Out Patient Pharmacy.

## 2016-06-08 ENCOUNTER — Other Ambulatory Visit: Payer: Self-pay

## 2016-06-08 MED ORDER — PREDNISONE 5 MG PO TABS: 5.0000 mg | ORAL_TABLET | Freq: Every day | ORAL | 3 refills | Status: DC

## 2016-06-08 NOTE — Telephone Encounter (Signed)
Patient is needing a refill of her prednisone. please advise

## 2016-06-08 NOTE — Telephone Encounter (Signed)
Ordered

## 2016-06-08 NOTE — Telephone Encounter (Signed)
Been out of medication for three days please call her patient asks.

## 2016-06-09 MED FILL — predniSONE 5 MG TABS: 5 | 30 days supply | Qty: 30 | Fill #0

## 2016-06-17 ENCOUNTER — Other Ambulatory Visit: Payer: 59

## 2016-06-18 ENCOUNTER — Other Ambulatory Visit: Payer: 59

## 2016-06-18 DIAGNOSIS — E348 Other specified endocrine disorders: Secondary | ICD-10-CM

## 2016-06-18 DIAGNOSIS — E281 Androgen excess: Secondary | ICD-10-CM

## 2016-06-18 NOTE — Telephone Encounter (Signed)
Pt is set up to get the implant of the mirena next week Tuesday. Can she do this? Will it effect anything related to her condition?

## 2016-06-19 LAB — TESTOSTERONE, FREE, TOTAL, SHBG
Sex Hormone Binding: 54 nmol/L (ref 24.6–122.0)
TESTOSTERONE FREE: 3.4 pg/mL (ref 0.0–4.2)
TESTOSTERONE: 35 ng/dL (ref 8–48)

## 2016-06-19 LAB — DHEA-SULFATE: DHEA-SO4: 347 ug/dL (ref 84.8–378.0)

## 2016-06-22 ENCOUNTER — Other Ambulatory Visit: Payer: Self-pay

## 2016-06-22 DIAGNOSIS — Z30430 Encounter for insertion of intrauterine contraceptive device: Secondary | ICD-10-CM | POA: Diagnosis not present

## 2016-06-22 MED ORDER — PREDNISONE 5 MG PO TABS: 5.0000 mg | ORAL_TABLET | Freq: Every day | ORAL | 3 refills | Status: DC

## 2016-06-22 NOTE — Telephone Encounter (Signed)
Patient is calling on the status of last message, please give her a call today.

## 2016-06-22 NOTE — Telephone Encounter (Signed)
See message, I have refilled the prednisone. Please advise on the implant question.

## 2016-06-24 ENCOUNTER — Ambulatory Visit (INDEPENDENT_AMBULATORY_CARE_PROVIDER_SITE_OTHER): Payer: 59 | Admitting: Endocrinology

## 2016-06-24 ENCOUNTER — Encounter: Payer: Self-pay | Admitting: Endocrinology

## 2016-06-24 VITALS — BP 110/80 | HR 80 | Ht 64.37 in | Wt 121.2 lb

## 2016-06-24 DIAGNOSIS — L658 Other specified nonscarring hair loss: Secondary | ICD-10-CM

## 2016-06-24 DIAGNOSIS — E348 Other specified endocrine disorders: Secondary | ICD-10-CM

## 2016-06-24 DIAGNOSIS — E281 Androgen excess: Secondary | ICD-10-CM

## 2016-06-24 NOTE — Telephone Encounter (Signed)
Patient ask for a refill of the Prednisone.    [facility] [place] [address] [phone] (Phone) [phone] (Fax)

## 2016-06-24 NOTE — Telephone Encounter (Signed)
Refill submitted on 06/22/2016.  

## 2016-06-24 NOTE — Progress Notes (Signed)
Patient ID: Lori Miranda, female   DOB: 02/06/1991, 26 y.o.   MRN: [id number]            Referring physician: Lendon KaKordsmeier  Chief complaint: Hair loss  History of Present Illness:   She has noticed thinning of hair here since about 2012 This has been to a variable extent in the past In 2012 she was on Depo-Provera shots which she thinks her hair loss was not different with stopping the Depo-Provera She has not had any associated change in her menstrual cycles She usually has been on some form of birth control products In 2014 she was evaluated by an endocrinologist, had been off birth control pills for 3 months for this evaluation and had the following labs: Total testosterone = 53, normal <76.  DHEAS 442, high  Over the last year she thinks her hair thinning has been somewhat worse but she has had no treatment for this Her menstrual cycles are recently coming every 5 weeks or so She has been off the birth control pill for a year and a half she does not notice any acne, facial hair or abnormal body hair.   Since she had both increased free testosterone 7.6 and DHEA level she was given a dexamethasone suppression test With this her androgen levels were low normal  She has been on a trial of prednisone 5 mg daily since mid November She has been instructed to take this at dinnertime and is very regular with this. She has not had any weight gain or increased appetite with this  She thinks that subjectively her hair loss is decreasing. Her testosterone and DHEA levels are in the normal range now as below She also has an IUD currently   Wt Readings from Last 3 Encounters:  06/24/16 121 lb 3.2 oz (55 kg)  04/21/16 124 lb 6.4 oz (56.4 kg)  03/01/16 123 lb 1.6 oz (55.8 kg)    Lab on 06/18/2016  Component Date Value Ref Range Status  . DHEA-SO4 06/19/2016 347.0  84.8 - 378.0 ug/dL Final  . Testosterone 78/29/562112/30/2017 35  8 - 48 ng/dL Final  . Testosterone, Free 06/19/2016 3.4  0.0 - 4.2  pg/mL Final  . Sex Hormone Binding 06/19/2016 54.0  24.6 - 122.0 nmol/L Final  Hospital Outpatient Visit on 04/30/2016  Component Date Value Ref Range Status  . DHEA-SO4 05/01/2016 138.7  84.8 - 378.0 ug/dL Final   Comment: (NOTE) Performed At: [facility] [address] [place], [place] [id number] Mila HomerHancock William F MD [id number]:[phone]   . Sex Hormone Binding 05/01/2016 71.4  24.6 - 122.0 nmol/L Final   Comment: (NOTE) Performed At: [facility] [address] [place], [place] [id number] Mila HomerHancock William F MD [id number]:[phone]   . Testosterone 05/01/2016 23  8 - 48 ng/dL Final  . Testosterone, Free 05/01/2016 0.6  0.0 - 4.2 pg/mL Final   Comment: (NOTE) Performed At: [facility] [address] [place], [place] [address] Mila HomerHancock William F MD PI:[id number]:[phone]   Lab on 04/22/2016  Component Date Value Ref Range Status  . DHEA-SO4 04/25/2016 479.8* 84.8 - 378.0 ug/dL Final  . Testosterone 63/01/601011/10/2015 52* 8 - 48 ng/dL Final  . Testosterone, Free 04/25/2016 7.6* 0.0 - 4.2 pg/mL Final  . Sex Hormone Binding 04/25/2016 64.7  24.6 - 122.0 nmol/L Final  . 17-Hydroxyprogesterone 04/25/2016 30  ng/dL Final   Comment:  Adult Female                            Follicular        15 -  72                            Luteal            35 - 290 This test was developed and its performance characteristics determined by [facility]. It has not been cleared or approved by the Food and Drug Administration.      Past Medical History:  Diagnosis Date  . Heart murmur     Past Surgical History:  Procedure Laterality Date  . APPENDECTOMY      Family History  Problem Relation Age of Onset  . Hypertension Mother   . Diabetes Mother     Social History:  reports that she has never smoked. She has never used smokeless tobacco. She reports that she drinks about 2.4 oz of alcohol per week . She reports that she does not use drugs.  Allergies: Not on  File  Allergies as of 06/24/2016   Not on File     Medication List       Accurate as of 06/24/16  3:44 PM. Always use your most recent med list.          levonorgestrel 20 MCG/24HR IUD Commonly known as:  MIRENA 1 each by Intrauterine route once.   predniSONE 5 MG tablet Commonly known as:  DELTASONE Take 1 tablet (5 mg total) by mouth daily before supper.       LABS:  Lab on 06/18/2016  Component Date Value Ref Range Status  . DHEA-SO4 06/19/2016 347.0  84.8 - 378.0 ug/dL Final  . Testosterone 40/98/1191 35  8 - 48 ng/dL Final  . Testosterone, Free 06/19/2016 3.4  0.0 - 4.2 pg/mL Final  . Sex Hormone Binding 06/19/2016 54.0  24.6 - 122.0 nmol/L Final        Review of Systems   Has mild fatigue, not unusual for her scheduled  PHYSICAL EXAM:  BP 110/80   Pulse 80   Ht 5' 4.37" (1.635 m)   Wt 121 lb 3.2 oz (55 kg)   SpO2 98%   BMI 20.57 kg/m     ASSESSMENT:    Female pattern hair loss.  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  Subjectively she thinks her hair loss is improving and she is tolerating prednisone well   PLAN:   Continue 5 mg prednisone for at least 6 months and at that point consider tapering Will reassess her hormone levels in 4 months again   [facility] 06/24/2016, 3:44 PM

## 2016-07-14 MED FILL — predniSONE 5 MG TABS: 5 | 30 days supply | Qty: 30 | Fill #0

## 2016-08-05 DIAGNOSIS — Z30431 Encounter for routine checking of intrauterine contraceptive device: Secondary | ICD-10-CM | POA: Diagnosis not present

## 2016-08-09 MED FILL — predniSONE 5 MG TABS: 5 | 30 days supply | Qty: 30 | Fill #1

## 2016-09-05 ENCOUNTER — Encounter: Payer: Self-pay | Admitting: Family Medicine

## 2016-09-20 ENCOUNTER — Ambulatory Visit (INDEPENDENT_AMBULATORY_CARE_PROVIDER_SITE_OTHER): Payer: 59 | Admitting: Family Medicine

## 2016-09-20 ENCOUNTER — Encounter: Payer: Self-pay | Admitting: Family Medicine

## 2016-09-20 VITALS — BP 138/88 | HR 74 | Temp 98.5°F | Wt 120.4 lb

## 2016-09-20 DIAGNOSIS — L2489 Irritant contact dermatitis due to other agents: Secondary | ICD-10-CM

## 2016-09-20 MED ORDER — TRIAMCINOLONE ACETONIDE 0.025 % EX OINT: 1.0000 "application " | TOPICAL_OINTMENT | Freq: Two times a day (BID) | CUTANEOUS | 0 refills | Status: DC

## 2016-09-20 MED FILL — TRIAMCINOLONE 0.025% OINT: 0.025 | 10 days supply | Qty: 30 | Fill #0

## 2016-09-20 NOTE — Progress Notes (Signed)
Pre visit review using our clinic review tool, if applicable. No additional management support is needed unless otherwise documented below in the visit note. 

## 2016-09-20 NOTE — Patient Instructions (Addendum)
It was a pleasure to see you today. Please follow up if symptom does not improve with treatment, worsens, or you develop new symptoms.   Contact Dermatitis Dermatitis is redness, soreness, and swelling (inflammation) of the skin. Contact dermatitis is a reaction to certain substances that touch the skin. You either touched something that irritated your skin, or you have allergies to something you touched. Follow these instructions at home: Skin Care   Moisturize your skin as needed.  Apply cool compresses to the affected areas.  Try taking a bath with:  Epsom salts. Follow the instructions on the package. You can get these at a pharmacy or grocery store.  Baking soda. Pour a small amount into the bath as told by your doctor.  Colloidal oatmeal. Follow the instructions on the package. You can get this at a pharmacy or grocery store.  Try applying baking soda paste to your skin. Stir water into baking soda until it looks like paste.  Do not scratch your skin.  Bathe less often.  Bathe in lukewarm water. Avoid using hot water. Medicines   Take or apply over-the-counter and prescription medicines only as told by your doctor.  If you were prescribed an antibiotic medicine, take or apply your antibiotic as told by your doctor. Do not stop taking the antibiotic even if your condition starts to get better. General instructions   Keep all follow-up visits as told by your doctor. This is important.  Avoid the substance that caused your reaction. If you do not know what caused it, keep a journal to try to track what caused it. Write down:  What you eat.  What cosmetic products you use.  What you drink.  What you wear in the affected area. This includes jewelry.  If you were given a bandage (dressing), take care of it as told by your doctor. This includes when to change and remove it. Contact a doctor if:  You do not get better with treatment.  Your condition gets worse.  You  have signs of infection such as:  Swelling.  Tenderness.  Redness.  Soreness.  Warmth.  You have a fever.  You have new symptoms. Get help right away if:  You have a very bad headache.  You have neck pain.  Your neck is stiff.  You throw up (vomit).  You feel very sleepy.  You see red streaks coming from the affected area.  Your bone or joint underneath the affected area becomes painful after the skin has healed.  The affected area turns darker.  You have trouble breathing. This information is not intended to replace advice given to you by your health care provider. Make sure you discuss any questions you have with your health care provider. Document Released: 04/04/2009 Document Revised: 11/13/2015 Document Reviewed: 10/23/2014 Elsevier Interactive Patient Education  2017 Elsevier Inc.   WE NOW OFFER   [facility] FAST TRACK!!!  SAME DAY Appointments for ACUTE CARE  Such as: Sprains, Injuries, cuts, abrasions, rashes, muscle pain, joint pain, back pain Colds, flu, sore throats, headache, allergies, cough, fever  Ear pain, sinus and eye infections Abdominal pain, nausea, vomiting, diarrhea, upset stomach Animal/insect bites  3 Easy Ways to Schedule: Walk-In Scheduling Call in scheduling Mychart Sign-up: [email or web address]

## 2016-09-20 NOTE — Progress Notes (Signed)
Subjective:    Patient ID: Lori Miranda, female    DOB: [birth year], 26 y.o.   MRN: [id number]  HPI  Ms. Lori Miranda is a 26 year old female who presents today with a small red area noted on her right breast that is itching and started 2 months ago.   Treatment with cortisone has provided limited benefit.  She reports using a sticky bra that did cause discomfort as a possible trigger. Appearance of rash at onset:  Initial distribution: right breast noted at approximately 11:00 o'clock with a second area of redness at approximately 7 o'clock Discomfort associated: None noted Associated symptoms: Pruritus.  Denies: fever, chills sweats, myalgias, difficulty swallowing, hoarseness, SOB, N/V, abdominal pain, or tightening of throat.  No new exposures of soaps, lotions, laundry detergent, fabric softeners, foods, medications, plants, animals, or insects.  Treatment at home includes cortisone cream with limited benefit.   Review of Systems  Constitutional: Negative for chills, fatigue and fever.  Respiratory: Negative for cough, shortness of breath and wheezing.   Cardiovascular: Negative for chest pain, palpitations and leg swelling.  Gastrointestinal: Negative for diarrhea, nausea and vomiting.  Musculoskeletal: Negative for myalgias.  Skin: Positive for rash.  Neurological: Negative for dizziness, weakness and light-headedness.   Past Medical History:  Diagnosis Date  . Heart murmur      Social History   Social History  . Marital status: Single    Spouse name: N/A  . Number of children: N/A  . Years of education: N/A   Occupational History  . Cardiac Tech [employer]   Social History Main Topics  . Smoking status: Never Smoker  . Smokeless tobacco: Never Used  . Alcohol use 2.4 oz/week    4 Cans of beer per week  . Drug use: No  . Sexual activity: Yes    Birth control/ protection: Condom   Other Topics Concern  . Not on file   Social History Narrative   Engaged; will  marry in May 2018    Past Surgical History:  Procedure Laterality Date  . APPENDECTOMY      Family History  Problem Relation Age of Onset  . Hypertension Mother   . Diabetes Mother     Not on File  Current Outpatient Prescriptions on File Prior to Visit  Medication Sig Dispense Refill  . levonorgestrel (MIRENA) 20 MCG/24HR IUD 1 each by Intrauterine route once.    . predniSONE (DELTASONE) 5 MG tablet Take 1 tablet (5 mg total) by mouth daily before supper. 30 tablet 3   No current facility-administered medications on file prior to visit.     BP 138/88 (BP Location: Left Arm, Patient Position: Sitting, Cuff Size: Normal)   Pulse 74   Temp 98.5 F (36.9 C) (Oral)   Wt 120 lb 6.4 oz (54.6 kg)   SpO2 98%   BMI 20.43 kg/m       Objective:   Physical Exam  Constitutional: She is oriented to person, place, and time. She appears well-developed and well-nourished.  Eyes: Pupils are equal, round, and reactive to light. No scleral icterus.  Neck: Neck supple.  Cardiovascular: Normal rate and regular rhythm.   Pulmonary/Chest: Effort normal and breath sounds normal. She has no wheezes. She has no rales.  Lymphadenopathy:    She has no cervical adenopathy.  Neurological: She is alert and oriented to person, place, and time. Coordination normal.  Skin: Skin is warm and dry. Rash noted.  Area of erythema noted on breast  at 11 o'clock that is approximately 1.5 cm x 2 cm. Area of erythema noted at 7 o'clock is approximately 2 cm x 2 cm. No raised areas or lesions present. No discharge or oozing noted.  Psychiatric: She has a normal mood and affect. Her behavior is normal. Judgment and thought content normal.       Assessment & Plan:  1. Irritant contact dermatitis due to other agents Exam is reassuring; suspect irritant is from a stick on bra that caused discomfort with removal. Advised short term use of thin layer of triamcinolone and follow up if symptom does not improve with  treatment in 3 to 4 days, worsens, or she develops new symptoms.  - triamcinolone (KENALOG) 0.025 % ointment; Apply 1 application topically 2 (two) times daily.  Dispense: 30 g; Refill: 0  She voiced understanding and agreed with plan.  Roddie Mc, FNP-C

## 2016-09-24 ENCOUNTER — Other Ambulatory Visit: Payer: 59

## 2016-09-24 DIAGNOSIS — E348 Other specified endocrine disorders: Secondary | ICD-10-CM | POA: Diagnosis not present

## 2016-09-24 DIAGNOSIS — E281 Androgen excess: Secondary | ICD-10-CM

## 2016-09-26 LAB — DHEA-SULFATE: DHEA SO4: 319.4 ug/dL (ref 84.8–378.0)

## 2016-09-26 LAB — TESTOSTERONE, FREE, TOTAL, SHBG
Sex Hormone Binding: 58.1 nmol/L (ref 24.6–122.0)
TESTOSTERONE: 25 ng/dL (ref 8–48)
Testosterone, Free: 3.4 pg/mL (ref 0.0–4.2)

## 2016-09-27 ENCOUNTER — Ambulatory Visit (INDEPENDENT_AMBULATORY_CARE_PROVIDER_SITE_OTHER): Payer: 59 | Admitting: Endocrinology

## 2016-09-27 ENCOUNTER — Encounter: Payer: Self-pay | Admitting: Endocrinology

## 2016-09-27 VITALS — BP 122/78 | HR 86 | Ht 64.0 in | Wt 119.0 lb

## 2016-09-27 DIAGNOSIS — E348 Other specified endocrine disorders: Secondary | ICD-10-CM | POA: Diagnosis not present

## 2016-09-27 DIAGNOSIS — E281 Androgen excess: Secondary | ICD-10-CM

## 2016-09-27 NOTE — Progress Notes (Signed)
Patient ID: Lori Miranda, female   DOB: Mar 05, 1991, 26 y.o.   MRN: [id number]            Referring physician: Lendon Ka  Chief complaint: Hair loss  History of Present Illness:   Background history:  She had noticed thinning of hair since about 2012 In 2012 she was on Depo-Provera shots which she thinks her hair loss was not different with stopping the Depo-Provera She has not had any associated change in her menstrual cycles She usually has been on some form of birth control products In 2014 she was evaluated by an endocrinologist, had been off birth control pills for 3 months for this evaluation and had the following labs: Total testosterone = 53, normal <76.  DHEAS 442, high  Recent history: Seen in consultation with symptoms of her hair thinning being worse but she has had no treatment for this, this was not associated with facial hair, acne or menstrual changes She had been off the birth control pill for a year and a half  Since she had both increased free testosterone 7.6 and DHEA level she was given a dexamethasone suppression test With this her androgen levels were low normal  She has been on a trial of prednisone 5 mg daily since mid November She has been instructed to take this at dinnertime and is  regular with this. She has not had any weight gain or increased appetite with this  She thinks that subjectively her hair loss is overall less and is not cosmetically problem  Her testosterone and DHEA levels are consistently in the normal range now as below She also has an IUD currently   Wt Readings from Last 3 Encounters:  09/27/16 119 lb (54 kg)  09/20/16 120 lb 6.4 oz (54.6 kg)  06/24/16 121 lb 3.2 oz (55 kg)    Lab on 09/24/2016  Component Date Value Ref Range Status  . Testosterone 09/24/2016 25  8 - 48 ng/dL Final  . Testosterone, Free 09/24/2016 3.4  0.0 - 4.2 pg/mL Final  . Sex Hormone Binding 09/24/2016 58.1  24.6 - 122.0 nmol/L Final  . DHEA-SO4  09/24/2016 319.4  84.8 - 378.0 ug/dL Final     Past Medical History:  Diagnosis Date  . Heart murmur     Past Surgical History:  Procedure Laterality Date  . APPENDECTOMY      Family History  Problem Relation Age of Onset  . Hypertension Mother   . Diabetes Mother     Social History:  reports that she has never smoked. She has never used smokeless tobacco. She reports that she drinks about 2.4 oz of alcohol per week . She reports that she does not use drugs.  Allergies: Not on File  Allergies as of 09/27/2016   Not on File     Medication List       Accurate as of 09/27/16  3:15 PM. Always use your most recent med list.          levonorgestrel 20 MCG/24HR IUD Commonly known as:  MIRENA 1 each by Intrauterine route once.   predniSONE 5 MG tablet Commonly known as:  DELTASONE Take 1 tablet (5 mg total) by mouth daily before supper.   triamcinolone 0.025 % ointment Commonly known as:  KENALOG Apply 1 application topically 2 (two) times daily.       LABS:  Lab on 09/24/2016  Component Date Value Ref Range Status  . Testosterone 09/24/2016 25  8 - 48 ng/dL Final  .  Testosterone, Free 09/24/2016 3.4  0.0 - 4.2 pg/mL Final  . Sex Hormone Binding 09/24/2016 58.1  24.6 - 122.0 nmol/L Final  . 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

## 2016-09-27 NOTE — Patient Instructions (Signed)
June 1 start 2.5mg 

## 2016-10-05 MED FILL — predniSONE 5 MG TABS: 5 | 30 days supply | Qty: 30 | Fill #2

## 2016-11-12 MED FILL — predniSONE 5 MG TABS: 5 | 30 days supply | Qty: 30 | Fill #3

## 2016-12-24 ENCOUNTER — Other Ambulatory Visit: Payer: Self-pay

## 2016-12-24 ENCOUNTER — Telehealth: Payer: Self-pay | Admitting: Endocrinology

## 2016-12-24 MED ORDER — PREDNISONE 2.5 MG PO TABS: 2.5000 mg | ORAL_TABLET | Freq: Every day | ORAL | 3 refills | Status: DC

## 2016-12-24 MED FILL — predniSONE 2.5 MG TABS: 2.5 | 30 days supply | Qty: 30 | Fill #0

## 2016-12-24 NOTE — Telephone Encounter (Signed)
I have sent in a new prescription with a cosign to Dr. Lucianne MussKumar.

## 2016-12-24 NOTE — Telephone Encounter (Signed)
Needs the new rx for prednisone 2.5 mg [facility]. Patient is out of medicine.

## 2016-12-27 ENCOUNTER — Other Ambulatory Visit: Payer: 59

## 2016-12-31 ENCOUNTER — Ambulatory Visit: Payer: 59 | Admitting: Endocrinology

## 2017-01-19 MED FILL — predniSONE 2.5 MG TABS: 2.5 | 30 days supply | Qty: 30 | Fill #1

## 2017-02-02 ENCOUNTER — Other Ambulatory Visit: Payer: 59

## 2017-02-04 ENCOUNTER — Telehealth: Payer: Self-pay | Admitting: Endocrinology

## 2017-02-04 ENCOUNTER — Other Ambulatory Visit: Payer: Self-pay

## 2017-02-04 MED ORDER — PREDNISONE 2.5 MG PO TABS: 2.5000 mg | ORAL_TABLET | Freq: Every day | ORAL | 3 refills | Status: DC

## 2017-02-04 NOTE — Telephone Encounter (Signed)
Submitted

## 2017-02-04 NOTE — Telephone Encounter (Signed)
MEDICATION: predniSONE (DELTASONE) 2.5 MG tablet  PHARMACY:  [facility] - [place], [place] - [address]. [phone] (Phone) [phone] (Fax)   IS THIS A 90 DAY SUPPLY : no  IS PATIENT OUT OF MEDICTAION: yes  IF NOT; HOW MUCH IS LEFT: n/a  LAST APPOINTMENT DATE:09/27/16  NEXT APPOINTMENT DATE:03/28/17  OTHER COMMENTS:    **Let patient know to contact pharmacy at the end of the day to make sure medication is ready. **  ** Please notify patient to allow 48-72 hours to process**  **Encourage patient to contact the pharmacy for refills or they can request refills through [facility]**

## 2017-02-07 ENCOUNTER — Other Ambulatory Visit: Payer: 59

## 2017-02-10 ENCOUNTER — Ambulatory Visit: Payer: 59 | Admitting: Endocrinology

## 2017-03-04 DIAGNOSIS — H5213 Myopia, bilateral: Secondary | ICD-10-CM | POA: Diagnosis not present

## 2017-03-04 DIAGNOSIS — H5212 Myopia, left eye: Secondary | ICD-10-CM | POA: Diagnosis not present

## 2017-03-04 DIAGNOSIS — H52223 Regular astigmatism, bilateral: Secondary | ICD-10-CM | POA: Diagnosis not present

## 2017-03-10 ENCOUNTER — Encounter: Payer: Self-pay | Admitting: Family Medicine

## 2017-03-10 MED FILL — predniSONE 2.5 MG TABS: 2.5 | 30 days supply | Qty: 30 | Fill #2

## 2017-03-14 ENCOUNTER — Other Ambulatory Visit: Payer: 59

## 2017-03-14 DIAGNOSIS — E348 Other specified endocrine disorders: Secondary | ICD-10-CM | POA: Diagnosis not present

## 2017-03-14 DIAGNOSIS — E281 Androgen excess: Secondary | ICD-10-CM

## 2017-03-15 LAB — TESTOSTERONE, FREE, TOTAL, SHBG
SEX HORMONE BINDING: 48.9 nmol/L (ref 24.6–122.0)
TESTOSTERONE: 49 ng/dL — AB (ref 8–48)
Testosterone, Free: 3.2 pg/mL (ref 0.0–4.2)

## 2017-03-15 LAB — DHEA-SULFATE: DHEA-SO4: 468.1 ug/dL — ABNORMAL HIGH (ref 84.8–378.0)

## 2017-03-18 ENCOUNTER — Encounter: Payer: Self-pay | Admitting: Endocrinology

## 2017-03-28 ENCOUNTER — Encounter: Payer: Self-pay | Admitting: Endocrinology

## 2017-03-28 ENCOUNTER — Ambulatory Visit (INDEPENDENT_AMBULATORY_CARE_PROVIDER_SITE_OTHER): Payer: 59 | Admitting: Endocrinology

## 2017-03-28 VITALS — BP 120/70 | HR 66 | Ht 64.0 in | Wt 121.8 lb

## 2017-03-28 DIAGNOSIS — E348 Other specified endocrine disorders: Secondary | ICD-10-CM

## 2017-03-28 DIAGNOSIS — E281 Androgen excess: Secondary | ICD-10-CM

## 2017-03-28 NOTE — Progress Notes (Signed)
Patient ID: Lori Miranda, female   DOB: [birth year], 26 y.o.   MRN: [id number]            Referring physician: Lendon Ka  Chief complaint: Hair loss  History of Present Illness:   Background history:  She had noticed thinning of hair since about 2012 In 2012 she was on Depo-Provera shots which she thinks her hair loss was not different with stopping the Depo-Provera She has not had any associated change in her menstrual cycles She usually has been on some form of birth control products In 2014 she was evaluated by an endocrinologist, had been off birth control pills for 3 months for this evaluation and had the following labs: Total testosterone = 53, normal <76.  DHEAS 442, high  Recent history: Seen in consultation with symptoms of her hair thinning being worse but she has had no treatment for this, this was not associated with facial hair, acne or menstrual changes  Since she had both increased free testosterone 7.6 and DHEA level she was given a dexamethasone suppression test With this her androgen levels were low normal  She has been on prednisone daily since mid November 2017 She has been instructed to take this at dinnertime and is  regular with this. She has not had any weight gain or increased appetite with this  She thinks that subjectively her hair loss improved with this Also her DHEAS level and free testosterone back to normal  On her follow-up in 4/18 the PREDNISONE was reduced down to 2.5 mg  However in the last couple of months at least she thinks her hair loss is increasing She does not have any facial hair or acne  Her testosterone and DHEA levels are are now going above normal again and almost to baseline She uses an IUD for contraception   Wt Readings from Last 3 Encounters:  03/28/17 121 lb 12.8 oz (55.2 kg)  09/27/16 119 lb (54 kg)  09/20/16 120 lb 6.4 oz (54.6 kg)    Lab on 03/14/2017  Component Date Value Ref Range Status  . Testosterone  03/14/2017 49* 8 - 48 ng/dL Final  . Testosterone, Free 03/14/2017 3.2  0.0 - 4.2 pg/mL Final  . Sex Hormone Binding 03/14/2017 48.9  24.6 - 122.0 nmol/L Final  . DHEA-SO4 03/14/2017 468.1* 84.8 - 378.0 ug/dL Final     Past Medical History:  Diagnosis Date  . Heart murmur     Past Surgical History:  Procedure Laterality Date  . APPENDECTOMY      Family History  Problem Relation Age of Onset  . Hypertension Mother   . Diabetes Mother     Social History:  reports that she has never smoked. She has never used smokeless tobacco. She reports that she drinks about 2.4 oz of alcohol per week . She reports that she does not use drugs.  Allergies: No Known Allergies  Allergies as of 03/28/2017   No Known Allergies     Medication List       Accurate as of 03/28/17  4:05 PM. Always use your most recent med list.          Biotin 40981 MCG Tabs Take 1 tablet by mouth daily.   levonorgestrel 20 MCG/24HR IUD Commonly known as:  MIRENA 1 each by Intrauterine route once.   predniSONE 5 MG tablet Commonly known as:  DELTASONE Take 1 tablet (5 mg total) by mouth daily before supper.   predniSONE 2.5 MG tablet Commonly known as:  DELTASONE Take 1 tablet (2.5 mg total) by mouth daily before supper.       LABS:  No visits with results within 1 Week(s) from this visit.  Latest known visit with results is:  Lab on 03/14/2017  Component Date Value Ref Range Status  . Testosterone 03/14/2017 49* 8 - 48 ng/dL Final  . Testosterone, Free 03/14/2017 3.2  0.0 - 4.2 pg/mL Final  . Sex Hormone Binding 03/14/2017 48.9  24.6 - 122.0 nmol/L Final  . 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

## 2017-03-28 NOTE — Patient Instructions (Signed)
  prednisone

## 2017-03-29 MED ORDER — PREDNISONE 5 MG PO TABS: 5.0000 mg | ORAL_TABLET | Freq: Every day | ORAL | 3 refills | Status: DC

## 2017-03-29 MED FILL — predniSONE 5 MG TABS: 5 | 90 days supply | Qty: 90 | Fill #0

## 2017-03-29 NOTE — Telephone Encounter (Signed)
Pt prednisone 5 mg [facility]

## 2017-06-09 DIAGNOSIS — Z30431 Encounter for routine checking of intrauterine contraceptive device: Secondary | ICD-10-CM | POA: Diagnosis not present

## 2017-06-24 ENCOUNTER — Other Ambulatory Visit: Payer: 59

## 2017-06-28 ENCOUNTER — Other Ambulatory Visit: Payer: 59

## 2017-06-29 ENCOUNTER — Ambulatory Visit: Payer: 59 | Admitting: Endocrinology

## 2017-07-11 ENCOUNTER — Other Ambulatory Visit: Payer: 59

## 2017-07-14 MED FILL — predniSONE 5 MG TABS: 5 | 90 days supply | Qty: 90 | Fill #1

## 2017-07-21 ENCOUNTER — Other Ambulatory Visit: Payer: 59

## 2017-07-21 DIAGNOSIS — E348 Other specified endocrine disorders: Secondary | ICD-10-CM | POA: Diagnosis not present

## 2017-07-21 DIAGNOSIS — E281 Androgen excess: Secondary | ICD-10-CM

## 2017-07-22 LAB — DHEA-SULFATE: DHEA SO4: 195.9 ug/dL (ref 84.8–378.0)

## 2017-07-22 LAB — TESTOSTERONE, FREE, TOTAL, SHBG
Sex Hormone Binding: 71.6 nmol/L (ref 24.6–122.0)
TESTOSTERONE FREE: 1.9 pg/mL (ref 0.0–4.2)
TESTOSTERONE: 42 ng/dL (ref 8–48)

## 2017-07-26 NOTE — Progress Notes (Signed)
Patient ID: Lori MarketKerry A Miranda, female   DOB: [birth year], 27 y.o.   MRN: [id number]            Referring physician: Lendon KaKordsmeier  Chief complaint: Hair loss  History of Present Illness:   Background history:  She had noticed thinning of hair since about 2012 In 2012 she was on Depo-Provera shots which she thinks her hair loss was not different with stopping the Depo-Provera She has not had any associated change in her menstrual cycles She usually has been on some form of birth control products In 2014 she was evaluated by an endocrinologist, had been off birth control pills for 3 months for this evaluation and had the following labs: Total testosterone = 53, normal <76.  DHEAS 442, high  Recent history:   Patient was seen in consultation with baseline symptoms of her hair thinning being worse but she has had no treatment for this, this was not associated with facial hair, acne or menstrual changes  Since she had both increased free testosterone of 7.6 and DHEA level she was given a dexamethasone suppression test With this her androgen levels were low normal  She has been on prednisone daily since mid November 2017 She has been instructed to take this at dinnertime and is  regular with this. She has not had any weight gain or nausea with this  Initially her hair loss improved with this Also her DHEAS level and free testosterone came back to normal  On her follow-up in 4/18 the PREDNISONE was reduced down to 2.5 mg; however subsequently her hair loss was somewhat worse and she had higher levels of  testosterone and DHEA; DHEAS was above normal   She has been on 5 mg prednisone since 03/2017 but she has not seen any improvement in her hair although not losing as much She thinks she feels a little less tired with increasing the dose  She thinks she had tried Aldactone in the past without any improvement  Has had only a mild change in her weight She uses an IUD for contraception and  menstrual cycles cannot be assessed   Wt Readings from Last 3 Encounters:  07/27/17 123 lb 9.6 oz (56.1 kg)  03/28/17 121 lb 12.8 oz (55.2 kg)  09/27/16 119 lb (54 kg)    Lab on 07/21/2017  Component Date Value Ref Range Status  . DHEA-SO4 07/21/2017 195.9  84.8 - 378.0 ug/dL Final  . Testosterone 95/28/413201/31/2019 42  8 - 48 ng/dL Final  . Testosterone, Free 07/21/2017 1.9  0.0 - 4.2 pg/mL Final  . Sex Hormone Binding 07/21/2017 71.6  24.6 - 122.0 nmol/L Final     Past Medical History:  Diagnosis Date  . Heart murmur     Past Surgical History:  Procedure Laterality Date  . APPENDECTOMY      Family History  Problem Relation Age of Onset  . Hypertension Mother   . Diabetes Mother     Social History:  reports that  has never smoked. she has never used smokeless tobacco. She reports that she drinks about 2.4 oz of alcohol per week. She reports that she does not use drugs.  Allergies: No Known Allergies  Allergies as of 07/27/2017   No Known Allergies     Medication List        Accurate as of 07/27/17  8:55 AM. Always use your most recent med list.          Biotin 4401010000 MCG Tabs Take 1 tablet  by mouth daily.   levonorgestrel 20 MCG/24HR IUD Commonly known as:  MIRENA 1 each by Intrauterine route once.   predniSONE 5 MG tablet Commonly known as:  DELTASONE Take 1 tablet (5 mg total) by mouth daily before supper.   ZYRTEC ALLERGY 10 MG tablet Generic drug:  cetirizine Take 10 mg by mouth as needed for allergies.       LABS:  Lab on 07/21/2017  Component Date Value Ref Range Status  . DHEA-SO4 07/21/2017 195.9  84.8 - 378.0 ug/dL Final  . Testosterone 16/03/9603 42  8 - 48 ng/dL Final  . Testosterone, Free 07/21/2017 1.9  0.0 - 4.2 pg/mL Final  . 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

## 2017-07-27 ENCOUNTER — Encounter: Payer: Self-pay | Admitting: Endocrinology

## 2017-07-27 ENCOUNTER — Ambulatory Visit (INDEPENDENT_AMBULATORY_CARE_PROVIDER_SITE_OTHER): Payer: 59 | Admitting: Endocrinology

## 2017-07-27 VITALS — BP 122/70 | HR 61 | Ht 64.0 in | Wt 123.6 lb

## 2017-07-27 DIAGNOSIS — E348 Other specified endocrine disorders: Secondary | ICD-10-CM

## 2017-07-27 DIAGNOSIS — E281 Androgen excess: Secondary | ICD-10-CM

## 2017-09-02 DIAGNOSIS — E249 Cushing's syndrome, unspecified: Secondary | ICD-10-CM | POA: Diagnosis not present

## 2017-09-02 DIAGNOSIS — Z6821 Body mass index (BMI) 21.0-21.9, adult: Secondary | ICD-10-CM | POA: Diagnosis not present

## 2017-09-02 DIAGNOSIS — L659 Nonscarring hair loss, unspecified: Secondary | ICD-10-CM | POA: Diagnosis not present

## 2017-09-02 DIAGNOSIS — Z01419 Encounter for gynecological examination (general) (routine) without abnormal findings: Secondary | ICD-10-CM | POA: Diagnosis not present

## 2017-10-04 DIAGNOSIS — E249 Cushing's syndrome, unspecified: Secondary | ICD-10-CM | POA: Diagnosis not present

## 2017-10-04 DIAGNOSIS — L658 Other specified nonscarring hair loss: Secondary | ICD-10-CM | POA: Diagnosis not present

## 2017-10-11 MED FILL — predniSONE 5 MG TABS: 5 | 90 days supply | Qty: 90 | Fill #2

## 2017-10-12 ENCOUNTER — Telehealth: Payer: Self-pay | Admitting: Family Medicine

## 2017-10-12 NOTE — Telephone Encounter (Signed)
Copied from CRM [phone]. Topic: Quick Communication - See Telephone Encounter >> Oct 12, 2017  3:12 PM Diana EvesHoyt, Maryann B wrote: CRM for notification. See Telephone encounter for: 10/12/17.  Pt would like to know how much vitamin D to take due to having levels checked with dermatology. Her vitamin D level is 25.

## 2017-10-13 NOTE — Telephone Encounter (Signed)
Tried calling patient voicemail box full. [facility]

## 2017-10-13 NOTE — Telephone Encounter (Signed)
I did not see this patient for a vitamin D level and do not see one located in EMR. Please advise that she contact the provider who ordered the lab work for appropriate recommendations.

## 2017-10-20 NOTE — Telephone Encounter (Signed)
Left voicemail to call , mychart message sent

## 2017-11-02 ENCOUNTER — Telehealth: Payer: 59 | Admitting: Family

## 2017-11-02 DIAGNOSIS — J301 Allergic rhinitis due to pollen: Secondary | ICD-10-CM

## 2017-11-02 MED ORDER — LEVOCETIRIZINE DIHYDROCHLORIDE 5 MG PO TABS: 5.0000 mg | ORAL_TABLET | Freq: Every evening | ORAL | 1 refills | Status: DC

## 2017-11-02 MED ORDER — FLUTICASONE PROPIONATE 50 MCG/ACT NA SUSP: 2.0000 | Freq: Every day | NASAL | 6 refills | Status: DC

## 2017-11-02 MED FILL — FLUTICASONE PROP 50 MCG SPR: 50 | 30 days supply | Qty: 16 | Fill #0

## 2017-11-02 MED FILL — LEVOCETIRIZINE 5 MG TABLET: 5 | 90 days supply | Qty: 90 | Fill #0

## 2017-11-02 NOTE — Progress Notes (Signed)
E visit for Allergic Rhinitis We are sorry that you are not feeling well.  Her is how we plan to help!  Based on what you have shared with me it looks like you have Allergic Rhinitis.  Rhinitis is when a reaction occurs that causes nasal congestion, runny nose, sneezing, and itching.  Most types of rhinitis are caused by an inflammation and are associated with symptoms in the eyes ears or throat. There are several types of rhinitis.  The most common are acute rhinitis, which is usually caused by a viral illness, allergic or seasonal rhinitis, and nonallergic or year-round rhinitis.  Nasal allergies occur certain times of the year.  Allergic rhinitis is caused when allergens in the air trigger the release of histamine in the body.  Histamine causes itching, swelling, and fluid to build up in the fragile linings of the nasal passages, sinuses and eyelids.  An itchy nose and clear discharge are common.  I recommend the following over the counter treatments: You should take a daily dose of antihistamine and Xyzal 5 mg take 1 tablet daily  I also would recommend a nasal spray: Flonase 2 sprays into each nostril once daily     HOME CARE:   You can use an over-the-counter saline nasal spray as needed  Avoid areas where there is heavy dust, mites, or molds  Stay indoors on windy days during the pollen season  Keep windows closed in home, at least in bedroom; use air conditioner.  Use high-efficiency house air filter  Keep windows closed in car, turn AC on re-circulate  Avoid playing out with dog during pollen season  GET HELP RIGHT AWAY IF:   If your symptoms do not improve within 10 days  You become short of breath  You develop yellow or green discharge from your nose for over 3 days  You have coughing fits  MAKE SURE YOU:   Understand these instructions  Will watch your condition  Will get help right away if you are not doing well or get worse  Thank you for choosing an  e-visit. Your e-visit answers were reviewed by a board certified advanced clinical practitioner to complete your personal care plan. Depending upon the condition, your plan could have included both over the counter or prescription medications. Please review your pharmacy choice. Be sure that the pharmacy you have chosen is open so that you can pick up your prescription now.  If there is a problem you may message your provider in MyChart to have the prescription routed to another pharmacy. Your safety is important to us. If you have drug allergies check your prescription carefully.  For the next 24 hours, you can use MyChart to ask questions about today's visit, request a non-urgent call back, or ask for a work or school excuse from your e-visit provider. You will get an email in the next two days asking about your experience. I hope that your e-visit has been valuable and will speed your recovery.         

## 2017-12-26 MED FILL — FLUTICASONE PROP 50 MCG SPR: 50 | 30 days supply | Qty: 16 | Fill #1

## 2017-12-27 DIAGNOSIS — H5213 Myopia, bilateral: Secondary | ICD-10-CM | POA: Diagnosis not present

## 2018-01-20 ENCOUNTER — Other Ambulatory Visit: Payer: 59

## 2018-01-24 ENCOUNTER — Ambulatory Visit: Payer: 59 | Admitting: Endocrinology

## 2018-02-01 MED FILL — LEVOCETIRIZINE 5 MG TABLET: 5 | 90 days supply | Qty: 90 | Fill #1

## 2018-02-01 MED FILL — predniSONE 5 MG TABS: 5 | 90 days supply | Qty: 90 | Fill #3

## 2018-02-01 MED FILL — FLUTICASONE PROP 50 MCG SPR: 50 | 30 days supply | Qty: 16 | Fill #2

## 2018-02-02 DIAGNOSIS — Z30432 Encounter for removal of intrauterine contraceptive device: Secondary | ICD-10-CM | POA: Diagnosis not present

## 2018-02-17 ENCOUNTER — Other Ambulatory Visit: Payer: 59

## 2018-02-22 ENCOUNTER — Ambulatory Visit: Payer: 59 | Admitting: Endocrinology

## 2018-03-28 DIAGNOSIS — Z3200 Encounter for pregnancy test, result unknown: Secondary | ICD-10-CM | POA: Diagnosis not present

## 2018-03-30 DIAGNOSIS — N912 Amenorrhea, unspecified: Secondary | ICD-10-CM | POA: Diagnosis not present

## 2018-04-11 ENCOUNTER — Other Ambulatory Visit: Payer: Self-pay

## 2018-04-11 ENCOUNTER — Inpatient Hospital Stay: Admit: 2018-04-11 | Payer: 59 | Admitting: Obstetrics and Gynecology

## 2018-04-11 ENCOUNTER — Encounter (HOSPITAL_COMMUNITY)
Admission: AD | Disposition: A | Discharge status: Home / Self Care | Payer: Self-pay | Source: Ambulatory Visit | Attending: Obstetrics and Gynecology

## 2018-04-11 ENCOUNTER — Inpatient Hospital Stay (HOSPITAL_COMMUNITY): Payer: 59 | Admitting: Anesthesiology

## 2018-04-11 ENCOUNTER — Ambulatory Visit (HOSPITAL_COMMUNITY)
Admission: AD | Admit: 2018-04-11 | Discharge: 2018-04-11 | Disposition: A | Discharge status: Home / Self Care | Payer: 59 | Source: Ambulatory Visit | Attending: Obstetrics and Gynecology | Admitting: Obstetrics and Gynecology

## 2018-04-11 ENCOUNTER — Encounter (HOSPITAL_COMMUNITY): Payer: Self-pay | Admitting: *Deleted

## 2018-04-11 DIAGNOSIS — O00202 Left ovarian pregnancy without intrauterine pregnancy: Secondary | ICD-10-CM | POA: Insufficient documentation

## 2018-04-11 DIAGNOSIS — Z8249 Family history of ischemic heart disease and other diseases of the circulatory system: Secondary | ICD-10-CM | POA: Insufficient documentation

## 2018-04-11 DIAGNOSIS — R011 Cardiac murmur, unspecified: Secondary | ICD-10-CM | POA: Insufficient documentation

## 2018-04-11 DIAGNOSIS — Z833 Family history of diabetes mellitus: Secondary | ICD-10-CM | POA: Diagnosis not present

## 2018-04-11 DIAGNOSIS — Z3A01 Less than 8 weeks gestation of pregnancy: Secondary | ICD-10-CM | POA: Diagnosis not present

## 2018-04-11 DIAGNOSIS — K661 Hemoperitoneum: Secondary | ICD-10-CM | POA: Diagnosis not present

## 2018-04-11 DIAGNOSIS — O009 Unspecified ectopic pregnancy without intrauterine pregnancy: Secondary | ICD-10-CM | POA: Diagnosis not present

## 2018-04-11 DIAGNOSIS — N911 Secondary amenorrhea: Secondary | ICD-10-CM | POA: Diagnosis not present

## 2018-04-11 HISTORY — DX: Unspecified ovarian cyst, unspecified side: N83.209

## 2018-04-11 HISTORY — PX: DIAGNOSTIC LAPAROSCOPY WITH REMOVAL OF ECTOPIC PREGNANCY: SHX6449

## 2018-04-11 LAB — CBC
HCT: 40 % (ref 36.0–46.0)
Hemoglobin: 13.3 g/dL (ref 12.0–15.0)
MCH: 31.1 pg (ref 26.0–34.0)
MCHC: 33.3 g/dL (ref 30.0–36.0)
MCV: 93.7 fL (ref 80.0–100.0)
Platelets: 281 10*3/uL (ref 150–400)
RBC: 4.27 MIL/uL (ref 3.87–5.11)
RDW: 12.2 % (ref 11.5–15.5)
WBC: 8.3 10*3/uL (ref 4.0–10.5)
nRBC: 0 % (ref 0.0–0.2)

## 2018-04-11 LAB — TYPE AND SCREEN
ABO/RH(D): A NEG
Antibody Screen: NEGATIVE

## 2018-04-11 LAB — ABO/RH: ABO/RH(D): A NEG

## 2018-04-11 LAB — HCG, QUANTITATIVE, PREGNANCY: HCG, BETA CHAIN, QUANT, S: 18770 m[IU]/mL — AB (ref <5)

## 2018-04-11 SURGERY — LAPAROSCOPY, WITH ECTOPIC PREGNANCY SURGICAL TREATMENT
Anesthesia: General | Site: Abdomen

## 2018-04-11 MED ORDER — SCOPOLAMINE 1 MG/3DAYS TD PT72
MEDICATED_PATCH | TRANSDERMAL | Status: AC
  Filled 2018-04-11: qty 1

## 2018-04-11 MED ORDER — KETOROLAC TROMETHAMINE 30 MG/ML IJ SOLN
INTRAMUSCULAR | Status: AC
  Filled 2018-04-11: qty 1

## 2018-04-11 MED ORDER — FENTANYL CITRATE (PF) 100 MCG/2ML IJ SOLN: 25.0000 ug | INTRAMUSCULAR | Status: DC | PRN

## 2018-04-11 MED ORDER — DEXAMETHASONE SODIUM PHOSPHATE 4 MG/ML IJ SOLN
INTRAMUSCULAR | Status: DC | PRN
  Administered 2018-04-11: 4 mg via INTRAVENOUS

## 2018-04-11 MED ORDER — SOD CITRATE-CITRIC ACID 500-334 MG/5ML PO SOLN
30.0000 mL | Freq: Once | ORAL | Status: AC
  Administered 2018-04-11: 30 mL via ORAL
  Filled 2018-04-11: qty 15

## 2018-04-11 MED ORDER — METOCLOPRAMIDE HCL 5 MG/ML IJ SOLN: 10.0000 mg | Freq: Once | INTRAMUSCULAR | Status: DC | PRN

## 2018-04-11 MED ORDER — HYDROMORPHONE HCL 1 MG/ML IJ SOLN
INTRAMUSCULAR | Status: DC | PRN
  Administered 2018-04-11: 1 mg via INTRAVENOUS

## 2018-04-11 MED ORDER — FENTANYL CITRATE (PF) 250 MCG/5ML IJ SOLN
INTRAMUSCULAR | Status: AC
  Filled 2018-04-11: qty 5

## 2018-04-11 MED ORDER — HYDROCODONE-ACETAMINOPHEN 7.5-325 MG PO TABS: 1.0000 | ORAL_TABLET | Freq: Once | ORAL | Status: DC | PRN

## 2018-04-11 MED ORDER — SUCCINYLCHOLINE CHLORIDE 200 MG/10ML IV SOSY
PREFILLED_SYRINGE | INTRAVENOUS | Status: AC
  Filled 2018-04-11: qty 10

## 2018-04-11 MED ORDER — MIDAZOLAM HCL 2 MG/2ML IJ SOLN
INTRAMUSCULAR | Status: AC
  Filled 2018-04-11: qty 2

## 2018-04-11 MED ORDER — LACTATED RINGERS IV BOLUS
1000.0000 mL | Freq: Once | INTRAVENOUS | Status: AC
  Administered 2018-04-11: 1000 mL via INTRAVENOUS

## 2018-04-11 MED ORDER — DEXAMETHASONE SODIUM PHOSPHATE 4 MG/ML IJ SOLN
INTRAMUSCULAR | Status: AC
  Filled 2018-04-11: qty 1

## 2018-04-11 MED ORDER — PROPOFOL 10 MG/ML IV BOLUS
INTRAVENOUS | Status: AC
  Filled 2018-04-11: qty 20

## 2018-04-11 MED ORDER — RHO D IMMUNE GLOBULIN 1500 UNIT/2ML IJ SOSY
300.0000 ug | PREFILLED_SYRINGE | Freq: Once | INTRAMUSCULAR | Status: DC
  Filled 2018-04-11: qty 2

## 2018-04-11 MED ORDER — HYDROMORPHONE HCL 1 MG/ML IJ SOLN
INTRAMUSCULAR | Status: AC
  Filled 2018-04-11: qty 1

## 2018-04-11 MED ORDER — KETOROLAC TROMETHAMINE 30 MG/ML IJ SOLN
INTRAMUSCULAR | Status: DC | PRN
  Administered 2018-04-11: 30 mg via INTRAVENOUS

## 2018-04-11 MED ORDER — IBUPROFEN 800 MG PO TABS: 800.0000 mg | ORAL_TABLET | Freq: Three times a day (TID) | ORAL | 0 refills | Status: DC | PRN

## 2018-04-11 MED ORDER — ROCURONIUM BROMIDE 100 MG/10ML IV SOLN
INTRAVENOUS | Status: DC | PRN
  Administered 2018-04-11: 5 mg via INTRAVENOUS
  Administered 2018-04-11: 10 mg via INTRAVENOUS
  Administered 2018-04-11: 25 mg via INTRAVENOUS

## 2018-04-11 MED ORDER — LIDOCAINE HCL (CARDIAC) PF 100 MG/5ML IV SOSY
PREFILLED_SYRINGE | INTRAVENOUS | Status: DC | PRN
  Administered 2018-04-11: 100 mg via INTRAVENOUS

## 2018-04-11 MED ORDER — MEPERIDINE HCL 25 MG/ML IJ SOLN
INTRAMUSCULAR | Status: AC
  Filled 2018-04-11: qty 1

## 2018-04-11 MED ORDER — LIDOCAINE HCL (CARDIAC) PF 100 MG/5ML IV SOSY
PREFILLED_SYRINGE | INTRAVENOUS | Status: AC
  Filled 2018-04-11: qty 5

## 2018-04-11 MED ORDER — MIDAZOLAM HCL 2 MG/2ML IJ SOLN
INTRAMUSCULAR | Status: DC | PRN
  Administered 2018-04-11: 2 mg via INTRAVENOUS

## 2018-04-11 MED ORDER — SCOPOLAMINE 1 MG/3DAYS TD PT72
MEDICATED_PATCH | TRANSDERMAL | Status: DC | PRN
  Administered 2018-04-11: 1 via TRANSDERMAL

## 2018-04-11 MED ORDER — HYDROCODONE-ACETAMINOPHEN 5-325 MG PO TABS: 1.0000 | ORAL_TABLET | Freq: Four times a day (QID) | ORAL | 0 refills | Status: DC | PRN

## 2018-04-11 MED ORDER — ONDANSETRON HCL 4 MG/2ML IJ SOLN
INTRAMUSCULAR | Status: AC
  Filled 2018-04-11: qty 2

## 2018-04-11 MED ORDER — PROPOFOL 10 MG/ML IV BOLUS
INTRAVENOUS | Status: DC | PRN
  Administered 2018-04-11: 200 mg via INTRAVENOUS

## 2018-04-11 MED ORDER — SUGAMMADEX SODIUM 200 MG/2ML IV SOLN
INTRAVENOUS | Status: DC | PRN
  Administered 2018-04-11: 200 mg via INTRAVENOUS

## 2018-04-11 MED ORDER — MEPERIDINE HCL 25 MG/ML IJ SOLN
6.2500 mg | INTRAMUSCULAR | Status: DC | PRN
  Administered 2018-04-11 (×2): 12.5 mg via INTRAVENOUS

## 2018-04-11 MED ORDER — LACTATED RINGERS IV SOLN
INTRAVENOUS | Status: DC | PRN
  Administered 2018-04-11 (×2): via INTRAVENOUS

## 2018-04-11 MED ORDER — ONDANSETRON HCL 4 MG/2ML IJ SOLN
INTRAMUSCULAR | Status: DC | PRN
  Administered 2018-04-11: 4 mg via INTRAVENOUS

## 2018-04-11 MED ORDER — ROCURONIUM BROMIDE 100 MG/10ML IV SOLN
INTRAVENOUS | Status: AC
  Filled 2018-04-11: qty 1

## 2018-04-11 MED ORDER — SUGAMMADEX SODIUM 200 MG/2ML IV SOLN
INTRAVENOUS | Status: AC
  Filled 2018-04-11: qty 4

## 2018-04-11 MED ORDER — BUPIVACAINE HCL (PF) 0.25 % IJ SOLN
INTRAMUSCULAR | Status: AC
  Filled 2018-04-11: qty 30

## 2018-04-11 MED ORDER — RHO D IMMUNE GLOBULIN 1500 UNIT/2ML IJ SOSY
300.0000 ug | PREFILLED_SYRINGE | Freq: Once | INTRAMUSCULAR | Status: AC
  Administered 2018-04-11: 300 ug via INTRAMUSCULAR
  Filled 2018-04-11: qty 2

## 2018-04-11 MED ORDER — BUPIVACAINE HCL (PF) 0.25 % IJ SOLN
INTRAMUSCULAR | Status: DC | PRN
  Administered 2018-04-11: 4 mL

## 2018-04-11 MED ORDER — FENTANYL CITRATE (PF) 100 MCG/2ML IJ SOLN
INTRAMUSCULAR | Status: DC | PRN
  Administered 2018-04-11: 50 ug via INTRAVENOUS
  Administered 2018-04-11: 100 ug via INTRAVENOUS
  Administered 2018-04-11 (×2): 50 ug via INTRAVENOUS

## 2018-04-11 MED ORDER — SODIUM CHLORIDE 0.9 % IR SOLN
Status: DC | PRN
  Administered 2018-04-11: 3000 mL

## 2018-04-11 MED ORDER — SUCCINYLCHOLINE CHLORIDE 20 MG/ML IJ SOLN
INTRAMUSCULAR | Status: DC | PRN
  Administered 2018-04-11: 100 mg via INTRAVENOUS

## 2018-04-11 SURGICAL SUPPLY — 31 items
CABLE HIGH FREQUENCY MONO STRZ (ELECTRODE) IMPLANT
CATH ROBINSON RED A/P 16FR (CATHETERS) ×3 IMPLANT
DERMABOND ADVANCED (GAUZE/BANDAGES/DRESSINGS) ×2
DERMABOND ADVANCED .7 DNX12 (GAUZE/BANDAGES/DRESSINGS) ×1 IMPLANT
DRSG OPSITE POSTOP 3X4 (GAUZE/BANDAGES/DRESSINGS) ×3 IMPLANT
GLOVE BIO SURGEON STRL SZ 6.5 (GLOVE) ×2 IMPLANT
GLOVE BIO SURGEONS STRL SZ 6.5 (GLOVE) ×1
GLOVE BIOGEL PI IND STRL 6.5 (GLOVE) ×1 IMPLANT
GLOVE BIOGEL PI IND STRL 7.0 (GLOVE) ×2 IMPLANT
GLOVE BIOGEL PI INDICATOR 6.5 (GLOVE) ×2
GLOVE BIOGEL PI INDICATOR 7.0 (GLOVE) ×4
GOWN STRL REUS W/TWL LRG LVL3 (GOWN DISPOSABLE) ×6 IMPLANT
LIGASURE VESSEL 5MM BLUNT TIP (ELECTROSURGICAL) IMPLANT
NEEDLE INSUFFLATION 120MM (ENDOMECHANICALS) ×3 IMPLANT
NS IRRIG 1000ML POUR BTL (IV SOLUTION) ×3 IMPLANT
PACK LAPAROSCOPY BASIN (CUSTOM PROCEDURE TRAY) ×3 IMPLANT
PACK TRENDGUARD 450 HYBRID PRO (MISCELLANEOUS) ×1 IMPLANT
POUCH SPECIMEN RETRIEVAL 10MM (ENDOMECHANICALS) ×3 IMPLANT
PROTECTOR NERVE ULNAR (MISCELLANEOUS) ×6 IMPLANT
SCISSORS LAP 5X35 DISP (ENDOMECHANICALS) IMPLANT
SEALER TISSUE G2 CVD JAW 45CM (ENDOMECHANICALS) ×3 IMPLANT
SET IRRIG TUBING LAPAROSCOPIC (IRRIGATION / IRRIGATOR) ×3 IMPLANT
SLEEVE XCEL OPT CAN 5 100 (ENDOMECHANICALS) ×3 IMPLANT
SUT VICRYL 0 UR6 27IN ABS (SUTURE) ×3 IMPLANT
SUT VICRYL RAPIDE 4/0 PS 2 (SUTURE) ×3 IMPLANT
TOWEL OR 17X24 6PK STRL BLUE (TOWEL DISPOSABLE) ×6 IMPLANT
TRENDGUARD 450 HYBRID PRO PACK (MISCELLANEOUS) ×3
TROCAR XCEL NON-BLD 11X100MML (ENDOMECHANICALS) ×3 IMPLANT
TROCAR XCEL NON-BLD 5MMX100MML (ENDOMECHANICALS) ×3 IMPLANT
TUBING INSUF HEATED (TUBING) ×3 IMPLANT
WARMER LAPAROSCOPE (MISCELLANEOUS) ×3 IMPLANT

## 2018-04-11 NOTE — Anesthesia Postprocedure Evaluation (Signed)
Anesthesia Post Note  Patient: Lori Miranda  Procedure(s) Performed: DIAGNOSTIC LAPAROSCOPY WITH REMOVAL OF ECTOPIC PREGNANCY (N/A Abdomen)     Patient location during evaluation: PACU Anesthesia Type: General Level of consciousness: awake and alert and oriented Pain management: pain level controlled Vital Signs Assessment: post-procedure vital signs reviewed and stable Respiratory status: spontaneous breathing, nonlabored ventilation and respiratory function stable Cardiovascular status: blood pressure returned to baseline and stable Postop Assessment: no apparent nausea or vomiting Anesthetic complications: no    Last Vitals:  Vitals:   04/11/18 2100 04/11/18 2115  BP: (!) 143/92 (!) 144/99  Pulse: (!) 106 (!) 103  Resp: 16 17  Temp:    SpO2: 99% 100%    Last Pain:  Vitals:   04/11/18 2100  TempSrc:   PainSc: 2    Pain Goal:                 Hetty Linhart A.

## 2018-04-11 NOTE — MAU Note (Signed)
Sent over for surgical removal of ectopic preg. Denies pain or bleeding.  NKDA

## 2018-04-11 NOTE — Anesthesia Procedure Notes (Signed)
Procedure Name: Intubation Date/Time: 04/11/2018 6:58 PM Performed by: Elenore Paddy, CRNA Pre-anesthesia Checklist: Patient identified, Emergency Drugs available, Suction available, Patient being monitored and Timeout performed Patient Re-evaluated:Patient Re-evaluated prior to induction Oxygen Delivery Method: Circle system utilized Preoxygenation: Pre-oxygenation with 100% oxygen Induction Type: IV induction, Rapid sequence and Cricoid Pressure applied Laryngoscope Size: Mac and 3 Grade View: Grade I Tube type: Oral Number of attempts: 1 Airway Equipment and Method: Stylet Placement Confirmation: ETT inserted through vocal cords under direct vision,  breath sounds checked- equal and bilateral and positive ETCO2 Secured at: 21 cm Tube secured with: Tape Dental Injury: Teeth and Oropharynx as per pre-operative assessment

## 2018-04-11 NOTE — Transfer of Care (Signed)
Immediate Anesthesia Transfer of Care Note  Patient: Lori Miranda  Procedure(s) Performed: DIAGNOSTIC LAPAROSCOPY WITH REMOVAL OF ECTOPIC PREGNANCY (N/A Abdomen)  Patient Location: PACU  Anesthesia Type:General  Level of Consciousness: awake, alert  and oriented  Airway & Oxygen Therapy: Patient Spontanous Breathing and Patient connected to nasal cannula oxygen  Post-op Assessment: Report given to RN and Post -op Vital signs reviewed and stable  Post vital signs: Reviewed and stable  Last Vitals:  Vitals Value Taken Time  BP    Temp    Pulse 122 04/11/2018  8:13 PM  Resp 20 04/11/2018  8:13 PM  SpO2 100 % 04/11/2018  8:13 PM  Vitals shown include unvalidated device data.  Last Pain:  Vitals:   04/11/18 1745  TempSrc: Oral         Complications: No apparent anesthesia complications

## 2018-04-11 NOTE — Brief Op Note (Signed)
04/11/2018  8:09 PM  PATIENT:  Lori Miranda  27 y.o. female  PRE-OPERATIVE DIAGNOSIS:  Ectopic pregnancy  POST-OPERATIVE DIAGNOSIS:  ectopic pregnancy  PROCEDURE:  Procedure(s): DIAGNOSTIC LAPAROSCOPY WITH REMOVAL OF ECTOPIC PREGNANCY (N/A)  SURGEON:  Surgeon(s) and Role:    * Bren Steers, Madelaine Etienne, MD - Primary  PHYSICIAN ASSISTANT:   ASSISTANTS: none   ANESTHESIA:   general  EBL:  250 mL   BLOOD ADMINISTERED:none  DRAINS: none   LOCAL MEDICATIONS USED:  MARCAINE    and Amount: 5 ml  SPECIMEN:  Source of Specimen:  left ovary with ectopic pregnancy and left fallopian tube  DISPOSITION OF SPECIMEN:  PATHOLOGY  COUNTS:  YES  TOURNIQUET:  * No tourniquets in log *  DICTATION: .Note written in EPIC  PLAN OF CARE: Discharge to home after PACU  PATIENT DISPOSITION:  PACU - hemodynamically stable.   Delay start of Pharmacological VTE agent (>24hrs) due to surgical blood loss or risk of bleeding: not applicable

## 2018-04-11 NOTE — H&P (Signed)
Lori Miranda is an 27 y.o. female. With a 2cm left sided ectopic pregnancy diagnosed today in the office. She reports LLQ pain but denies vaginal bleeding. Ectopic is + FHTs.   Pertinent Gynecological History: Menses: pregnant Bleeding: n/a Contraception: none DES exposure: denies Blood transfusions: none Sexually transmitted diseases: no past history Previous GYN Procedures: n/a  Last mammogram: n/a  Last pap: normal Date: 2019 OB History: G1, P0   Menstrual History: No LMP recorded. (Menstrual status: IUD).    Past Medical History:  Diagnosis Date  . Heart murmur     Past Surgical History:  Procedure Laterality Date  . APPENDECTOMY      Family History  Problem Relation Age of Onset  . Hypertension Mother   . Diabetes Mother     Social History:  reports that she has never smoked. She has never used smokeless tobacco. She reports that she drinks about 4.0 standard drinks of alcohol per week. She reports that she does not use drugs.  Allergies: No Known Allergies  No medications prior to admission.    ROS  There were no vitals taken for this visit. Physical Exam  Gen: well appearing, NAD CV: Reg rate Pulm: NWOB Abd: soft, nondistended, nontender, no masses GYN: uterus 6 week size, no adnexa ttp/CMT Ext: No edema b/l  No results found for this or any previous visit (from the past 24 hour(s)).  No results found.  Assessment/Plan: 27 yo G1P0 @ 6.6 wga by LMP presenting with a left sided ectopic pregnancy with + FHTs. Pt has no RFs for ectopic. LLQ pain but no evidence of rupture. She was counseled regarding her options and ultimately she elected to proceed with [facility] and left sided salpingectomy. Risks discussed including infection, bleeding, damage to surrounding structures, need for additional procedures, decreased fertility, and the possibility of failure. She understands pelvic rest and strict fu. All questions answered. Consent signed. Proceed with above  surgery.   Stat HCG requested Stat CBC to eval hgb T&S - Rhogam if RH neg   Ranae Pila 04/11/2018, 5:13 PM

## 2018-04-11 NOTE — Op Note (Signed)
DATE OF PROCEDURE: 04/11/18  PREOPERATIVE DIAGNOSIS: left sided ectopic pregnancy  POSTOPERATIVE DIAGNOSIS: left sided ectopic pregnancy in LEFT OVARY, hemoperitoneum  PROCEDURE PERFORMED: Diagnostic laparoscopy with left sided salpingo-oophorectomy, evacuation of hemoperitoneum  SURGEON: Belva Agee, MD  ANESTHESIA: General  ESTIMATED BLOOD LOSS: 250 cc hemoperitoneum.  URINE OUTPUT: 200 cc of clear urine  COMPLICATIONS: None  TUBES: None.  DRAINS: None  PATHOLOGY: left fallopian tube with left ovary  FINDINGS: On exam, under anesthesia, normal appearing vulva and vagina, a normal sized uterus  Operative findings demonstrated hemoperitoneum, a normal uterus and normal right ovary with fallopian tube. Left fallopian tube wnl. Left ovary with ~2cm ectopic pregnancy actively bleeding.The appendix was absent. The bowel and omentum were normal appearing.  Procedure: A general anesthesia was induced and the patient was placed in the dirsal lithotomy position. The abdomen, perineum, and vagina were prepped and draped in the usual fashion. The bladder was drained. After the initial preparation, the procedure commenced at the vagina.3 With a speculum in place to visualize the cervix, the cervix was grasped and a jacobs tenaculum was placed within the uterine cavity for manipulation purposes being careful not to puncture the uterus. Attention was then turned to the abdomen.    An infraumbilical incision was made and the Veress needle was gently advanced taking care to feel for the typical sensation of penetrating the peritoneum. With CO2 infiltration, an opening pressure of 3 mmHg was noted, and following this, a pneumoperitoneum of 15 mmHg was created. A 11 mm trocar was then passed through the same incision and the laparoscope was then inserted through the trocar sleeve.   Visualization of the peritoneal cavity was then obtained and a brief inspection did not reveal any signs of  complications from entry. Under direct observation, 5mm suprapubic port was placed taking care to respect anatomical landmarks and vessels. Once the placement of the port was complete, the actual laparoscopic procedure began. Above operative findings noted. Decision made to remove both left ovary and fallopian tube given need to remove left ovary because of ectopic. A grasper was used to isolate the left IP ligament. Ureters was easily visualized. The IP was taken with two bites and sequential bites taken until the cornua was reached. Ureter was visualized the entire time. The ectopic pregnancy with the fallopian tube and ovary were easily remvoed with an endocatch bag under direct visualization. Hemostasis was noted.   The other fallopian tube was thoroughly inspected and noted to be normal.  All gas removed from abdomen and all instruments were removed from all places in the body. Fascia was closed with 0' vicryl and skin closed with 4'0 vicryl. The sponge and lap counts were correct times 2 at this time.   The patient's procedure was terminated. We then awakened her. She was sent to the Recovery Room in good condition.   She is RH neg and getting rhogam before d/c.

## 2018-04-11 NOTE — Anesthesia Preprocedure Evaluation (Signed)
Anesthesia Evaluation  Patient identified by MRN, date of birth, ID band Patient awake    Reviewed: Allergy & Precautions, NPO status , Patient's Chart, lab work & pertinent test results  Airway Mallampati: II  TM Distance: >3 FB Neck ROM: Full    Dental no notable dental hx.    Pulmonary neg pulmonary ROS,    Pulmonary exam normal breath sounds clear to auscultation       Cardiovascular negative cardio ROS Normal cardiovascular exam Rhythm:Regular Rate:Normal     Neuro/Psych negative neurological ROS  negative psych ROS   GI/Hepatic negative GI ROS, Neg liver ROS,   Endo/Other  negative endocrine ROS  Renal/GU negative Renal ROS  negative genitourinary   Musculoskeletal negative musculoskeletal ROS (+)   Abdominal   Peds negative pediatric ROS (+)  Hematology negative hematology ROS (+)   Anesthesia Other Findings   Reproductive/Obstetrics (+) Pregnancy                             Anesthesia Physical Anesthesia Plan  ASA: II and emergent  Anesthesia Plan: General   Post-op Pain Management:    Induction: Intravenous, Rapid sequence and Cricoid pressure planned  PONV Risk Score and Plan: 4 or greater and Ondansetron, Dexamethasone, Midazolam, Scopolamine patch - Pre-op and Treatment may vary due to age or medical condition  Airway Management Planned: Oral ETT  Additional Equipment:   Intra-op Plan:   Post-operative Plan: Extubation in OR  Informed Consent: I have reviewed the patients History and Physical, chart, labs and discussed the procedure including the risks, benefits and alternatives for the proposed anesthesia with the patient or authorized representative who has indicated his/her understanding and acceptance.   Dental advisory given  Plan Discussed with: CRNA  Anesthesia Plan Comments:         Anesthesia Quick Evaluation

## 2018-04-12 ENCOUNTER — Encounter (HOSPITAL_COMMUNITY): Payer: Self-pay | Admitting: Obstetrics and Gynecology

## 2018-04-12 LAB — RH IG WORKUP (INCLUDES ABO/RH)
ABO/RH(D): A NEG
GESTATIONAL AGE(WKS): 6
UNIT DIVISION: 0
Unit division: 0

## 2018-04-13 DIAGNOSIS — N912 Amenorrhea, unspecified: Secondary | ICD-10-CM | POA: Diagnosis not present

## 2018-04-21 DIAGNOSIS — Z3200 Encounter for pregnancy test, result unknown: Secondary | ICD-10-CM | POA: Diagnosis not present

## 2018-04-27 DIAGNOSIS — O009 Unspecified ectopic pregnancy without intrauterine pregnancy: Secondary | ICD-10-CM | POA: Diagnosis not present

## 2018-05-04 DIAGNOSIS — O039 Complete or unspecified spontaneous abortion without complication: Secondary | ICD-10-CM | POA: Diagnosis not present

## 2018-05-11 DIAGNOSIS — O039 Complete or unspecified spontaneous abortion without complication: Secondary | ICD-10-CM | POA: Diagnosis not present

## 2018-06-16 DIAGNOSIS — N912 Amenorrhea, unspecified: Secondary | ICD-10-CM | POA: Diagnosis not present

## 2018-06-19 DIAGNOSIS — Z0183 Encounter for blood typing: Secondary | ICD-10-CM | POA: Diagnosis not present

## 2018-06-19 DIAGNOSIS — N912 Amenorrhea, unspecified: Secondary | ICD-10-CM | POA: Diagnosis not present

## 2018-06-19 DIAGNOSIS — O200 Threatened abortion: Secondary | ICD-10-CM | POA: Diagnosis not present

## 2018-06-21 NOTE — L&D Delivery Note (Signed)
Vtx at +3 station and LOA.  Pt desires VE.  I discussed the R&B of VE including but not limited to injury to fetus but the potential benefit of expedited delivery.  She gives her informed consent and wishes to proceed. On the 5th pull delivered viable female apgars 9,9 over vag lac   Placenta delivered spontaneously intact with 3VC. Repair with 3-0 Chromic with good support and hemostasis noted and R/V exam confirms.  PH art was not done..  Mother and baby were doing well.  EBL 100cc  Louretta Shorten, MD

## 2018-06-22 DIAGNOSIS — N912 Amenorrhea, unspecified: Secondary | ICD-10-CM | POA: Diagnosis not present

## 2018-06-26 DIAGNOSIS — N912 Amenorrhea, unspecified: Secondary | ICD-10-CM | POA: Diagnosis not present

## 2018-06-30 DIAGNOSIS — N911 Secondary amenorrhea: Secondary | ICD-10-CM | POA: Diagnosis not present

## 2018-07-06 DIAGNOSIS — N911 Secondary amenorrhea: Secondary | ICD-10-CM | POA: Diagnosis not present

## 2018-07-25 DIAGNOSIS — Z3481 Encounter for supervision of other normal pregnancy, first trimester: Secondary | ICD-10-CM | POA: Diagnosis not present

## 2018-07-25 DIAGNOSIS — Z315 Encounter for genetic counseling: Secondary | ICD-10-CM | POA: Diagnosis not present

## 2018-07-25 DIAGNOSIS — Z3685 Encounter for antenatal screening for Streptococcus B: Secondary | ICD-10-CM | POA: Diagnosis not present

## 2018-07-25 LAB — OB RESULTS CONSOLE ABO/RH
RH Type: NEGATIVE
RH Type: POSITIVE

## 2018-07-25 LAB — OB RESULTS CONSOLE GC/CHLAMYDIA
Chlamydia: NEGATIVE
Gonorrhea: NEGATIVE

## 2018-07-25 LAB — OB RESULTS CONSOLE HIV ANTIBODY (ROUTINE TESTING): HIV: NONREACTIVE

## 2018-07-25 LAB — OB RESULTS CONSOLE RUBELLA ANTIBODY, IGM: Rubella: IMMUNE

## 2018-07-25 LAB — OB RESULTS CONSOLE ANTIBODY SCREEN: Antibody Screen: NEGATIVE

## 2018-07-25 LAB — OB RESULTS CONSOLE HEPATITIS B SURFACE ANTIGEN: Hepatitis B Surface Ag: NEGATIVE

## 2018-07-25 LAB — OB RESULTS CONSOLE RPR: RPR: NONREACTIVE

## 2018-07-31 DIAGNOSIS — Z124 Encounter for screening for malignant neoplasm of cervix: Secondary | ICD-10-CM | POA: Diagnosis not present

## 2018-07-31 DIAGNOSIS — Z3401 Encounter for supervision of normal first pregnancy, first trimester: Secondary | ICD-10-CM | POA: Diagnosis not present

## 2018-07-31 DIAGNOSIS — Z331 Pregnant state, incidental: Secondary | ICD-10-CM | POA: Diagnosis not present

## 2018-07-31 DIAGNOSIS — Z113 Encounter for screening for infections with a predominantly sexual mode of transmission: Secondary | ICD-10-CM | POA: Diagnosis not present

## 2018-08-14 DIAGNOSIS — Z3682 Encounter for antenatal screening for nuchal translucency: Secondary | ICD-10-CM | POA: Diagnosis not present

## 2018-08-14 DIAGNOSIS — Z3401 Encounter for supervision of normal first pregnancy, first trimester: Secondary | ICD-10-CM | POA: Diagnosis not present

## 2018-08-14 DIAGNOSIS — Z3A12 12 weeks gestation of pregnancy: Secondary | ICD-10-CM | POA: Diagnosis not present

## 2018-08-31 DIAGNOSIS — Z6711 Type A blood, Rh negative: Secondary | ICD-10-CM | POA: Diagnosis not present

## 2018-08-31 DIAGNOSIS — Z3481 Encounter for supervision of other normal pregnancy, first trimester: Secondary | ICD-10-CM | POA: Diagnosis not present

## 2018-08-31 DIAGNOSIS — Z3480 Encounter for supervision of other normal pregnancy, unspecified trimester: Secondary | ICD-10-CM | POA: Diagnosis not present

## 2018-10-05 DIAGNOSIS — Z3400 Encounter for supervision of normal first pregnancy, unspecified trimester: Secondary | ICD-10-CM | POA: Diagnosis not present

## 2018-10-05 DIAGNOSIS — Z3A19 19 weeks gestation of pregnancy: Secondary | ICD-10-CM | POA: Diagnosis not present

## 2018-10-05 DIAGNOSIS — Z363 Encounter for antenatal screening for malformations: Secondary | ICD-10-CM | POA: Diagnosis not present

## 2018-11-06 DIAGNOSIS — H5213 Myopia, bilateral: Secondary | ICD-10-CM | POA: Diagnosis not present

## 2018-11-07 DIAGNOSIS — Z362 Encounter for other antenatal screening follow-up: Secondary | ICD-10-CM | POA: Diagnosis not present

## 2018-11-07 DIAGNOSIS — Z3A24 24 weeks gestation of pregnancy: Secondary | ICD-10-CM | POA: Diagnosis not present

## 2018-11-07 DIAGNOSIS — Z3400 Encounter for supervision of normal first pregnancy, unspecified trimester: Secondary | ICD-10-CM | POA: Diagnosis not present

## 2018-12-05 DIAGNOSIS — Z3A28 28 weeks gestation of pregnancy: Secondary | ICD-10-CM | POA: Diagnosis not present

## 2018-12-05 DIAGNOSIS — Z3400 Encounter for supervision of normal first pregnancy, unspecified trimester: Secondary | ICD-10-CM | POA: Diagnosis not present

## 2018-12-05 DIAGNOSIS — Z23 Encounter for immunization: Secondary | ICD-10-CM | POA: Diagnosis not present

## 2018-12-05 DIAGNOSIS — O360920 Maternal care for other rhesus isoimmunization, second trimester, not applicable or unspecified: Secondary | ICD-10-CM | POA: Diagnosis not present

## 2018-12-05 DIAGNOSIS — Z3480 Encounter for supervision of other normal pregnancy, unspecified trimester: Secondary | ICD-10-CM | POA: Diagnosis not present

## 2019-01-29 DIAGNOSIS — Z3685 Encounter for antenatal screening for Streptococcus B: Secondary | ICD-10-CM | POA: Diagnosis not present

## 2019-01-29 LAB — OB RESULTS CONSOLE GBS
GBS: NEGATIVE
GBS: NEGATIVE

## 2019-02-16 ENCOUNTER — Telehealth (HOSPITAL_COMMUNITY): Payer: Self-pay | Admitting: *Deleted

## 2019-02-16 ENCOUNTER — Encounter (HOSPITAL_COMMUNITY): Payer: Self-pay | Admitting: *Deleted

## 2019-02-16 NOTE — Telephone Encounter (Signed)
Preadmission screen  

## 2019-02-21 ENCOUNTER — Other Ambulatory Visit: Payer: Self-pay

## 2019-02-21 ENCOUNTER — Inpatient Hospital Stay (HOSPITAL_COMMUNITY): Payer: 59 | Admitting: Anesthesiology

## 2019-02-21 ENCOUNTER — Encounter (HOSPITAL_COMMUNITY): Payer: Self-pay

## 2019-02-21 ENCOUNTER — Inpatient Hospital Stay (HOSPITAL_COMMUNITY)
Admission: AD | Admit: 2019-02-21 | Discharge: 2019-02-23 | DRG: 776 | Disposition: A | Discharge status: Home / Self Care | Payer: 59 | Attending: Obstetrics and Gynecology | Admitting: Obstetrics and Gynecology

## 2019-02-21 DIAGNOSIS — O26893 Other specified pregnancy related conditions, third trimester: Secondary | ICD-10-CM | POA: Diagnosis present

## 2019-02-21 DIAGNOSIS — Z3A39 39 weeks gestation of pregnancy: Secondary | ICD-10-CM

## 2019-02-21 DIAGNOSIS — Z20828 Contact with and (suspected) exposure to other viral communicable diseases: Secondary | ICD-10-CM | POA: Diagnosis present

## 2019-02-21 DIAGNOSIS — Z3490 Encounter for supervision of normal pregnancy, unspecified, unspecified trimester: Secondary | ICD-10-CM

## 2019-02-21 LAB — CBC
HCT: 37.2 % (ref 36.0–46.0)
HCT: 36.8 % (ref 36.0–46.0)
Hemoglobin: 12.3 g/dL (ref 12.0–15.0)
Hemoglobin: 12.1 g/dL (ref 12.0–15.0)
MCH: 31.3 pg (ref 26.0–34.0)
MCH: 31.3 pg (ref 26.0–34.0)
MCHC: 33.1 g/dL (ref 30.0–36.0)
MCHC: 32.9 g/dL (ref 30.0–36.0)
MCV: 94.7 fL (ref 80.0–100.0)
MCV: 95.1 fL (ref 80.0–100.0)
Platelets: 154 10*3/uL (ref 150–400)
Platelets: 148 10*3/uL — ABNORMAL LOW (ref 150–400)
RBC: 3.93 MIL/uL (ref 3.87–5.11)
RBC: 3.87 MIL/uL (ref 3.87–5.11)
RDW: 12.3 % (ref 11.5–15.5)
RDW: 12.3 % (ref 11.5–15.5)
WBC: 11.6 10*3/uL — ABNORMAL HIGH (ref 4.0–10.5)
WBC: 13.5 10*3/uL — ABNORMAL HIGH (ref 4.0–10.5)
nRBC: 0 % (ref 0.0–0.2)
nRBC: 0 % (ref 0.0–0.2)

## 2019-02-21 LAB — RPR: RPR Ser Ql: NONREACTIVE

## 2019-02-21 LAB — SARS CORONAVIRUS 2 BY RT PCR (HOSPITAL ORDER, PERFORMED IN ~~LOC~~ HOSPITAL LAB): SARS Coronavirus 2: NEGATIVE

## 2019-02-21 MED ORDER — LORATADINE 10 MG PO TABS
10.0000 mg | ORAL_TABLET | Freq: Every evening | ORAL | Status: DC
  Filled 2019-02-21 (×2): qty 1

## 2019-02-21 MED ORDER — OXYCODONE-ACETAMINOPHEN 5-325 MG PO TABS: 1.0000 | ORAL_TABLET | ORAL | Status: DC | PRN

## 2019-02-21 MED ORDER — FLEET ENEMA 7-19 GM/118ML RE ENEM: 1.0000 | ENEMA | RECTAL | Status: DC | PRN

## 2019-02-21 MED ORDER — MEDROXYPROGESTERONE ACETATE 150 MG/ML IM SUSP: 150.0000 mg | INTRAMUSCULAR | Status: DC | PRN

## 2019-02-21 MED ORDER — ONDANSETRON HCL 4 MG PO TABS: 4.0000 mg | ORAL_TABLET | ORAL | Status: DC | PRN

## 2019-02-21 MED ORDER — EPHEDRINE 5 MG/ML INJ: 10.0000 mg | INTRAVENOUS | Status: DC | PRN

## 2019-02-21 MED ORDER — SENNOSIDES-DOCUSATE SODIUM 8.6-50 MG PO TABS
2.0000 | ORAL_TABLET | ORAL | Status: DC
  Administered 2019-02-21 – 2019-02-22 (×2): 2 via ORAL
  Filled 2019-02-21 (×2): qty 2

## 2019-02-21 MED ORDER — PHENYLEPHRINE 40 MCG/ML (10ML) SYRINGE FOR IV PUSH (FOR BLOOD PRESSURE SUPPORT): 80.0000 ug | PREFILLED_SYRINGE | INTRAVENOUS | Status: DC | PRN

## 2019-02-21 MED ORDER — ACETAMINOPHEN 325 MG PO TABS
650.0000 mg | ORAL_TABLET | ORAL | Status: DC | PRN
  Administered 2019-02-22: 650 mg via ORAL
  Filled 2019-02-21: qty 2

## 2019-02-21 MED ORDER — DIPHENHYDRAMINE HCL 25 MG PO CAPS: 25.0000 mg | ORAL_CAPSULE | Freq: Four times a day (QID) | ORAL | Status: DC | PRN

## 2019-02-21 MED ORDER — WITCH HAZEL-GLYCERIN EX PADS: 1.0000 "application " | MEDICATED_PAD | CUTANEOUS | Status: DC | PRN

## 2019-02-21 MED ORDER — MEASLES, MUMPS & RUBELLA VAC IJ SOLR: 0.5000 mL | Freq: Once | INTRAMUSCULAR | Status: DC

## 2019-02-21 MED ORDER — OXYCODONE-ACETAMINOPHEN 5-325 MG PO TABS: 2.0000 | ORAL_TABLET | ORAL | Status: DC | PRN

## 2019-02-21 MED ORDER — ACETAMINOPHEN 325 MG PO TABS: 650.0000 mg | ORAL_TABLET | ORAL | Status: DC | PRN

## 2019-02-21 MED ORDER — BENZOCAINE-MENTHOL 20-0.5 % EX AERO
1.0000 "application " | INHALATION_SPRAY | CUTANEOUS | Status: DC | PRN
  Administered 2019-02-21: 1 via TOPICAL
  Filled 2019-02-21: qty 56

## 2019-02-21 MED ORDER — LACTATED RINGERS IV SOLN: 500.0000 mL | Freq: Once | INTRAVENOUS | Status: DC

## 2019-02-21 MED ORDER — LIDOCAINE HCL (PF) 1 % IJ SOLN: 30.0000 mL | INTRAMUSCULAR | Status: DC | PRN

## 2019-02-21 MED ORDER — LACTATED RINGERS IV SOLN
INTRAVENOUS | Status: DC
  Administered 2019-02-21: 09:00:00 via INTRAVENOUS

## 2019-02-21 MED ORDER — COCONUT OIL OIL: 1.0000 "application " | TOPICAL_OIL | Status: DC | PRN

## 2019-02-21 MED ORDER — OXYTOCIN 40 UNITS IN NORMAL SALINE INFUSION - SIMPLE MED
2.5000 [IU]/h | INTRAVENOUS | Status: DC
  Filled 2019-02-21: qty 1000

## 2019-02-21 MED ORDER — OXYTOCIN BOLUS FROM INFUSION
500.0000 mL | Freq: Once | INTRAVENOUS | Status: AC
  Administered 2019-02-21: 500 mL via INTRAVENOUS

## 2019-02-21 MED ORDER — DIBUCAINE (PERIANAL) 1 % EX OINT: 1.0000 "application " | TOPICAL_OINTMENT | CUTANEOUS | Status: DC | PRN

## 2019-02-21 MED ORDER — SODIUM CHLORIDE (PF) 0.9 % IJ SOLN
INTRAMUSCULAR | Status: DC | PRN
  Administered 2019-02-21: 12 mL/h via EPIDURAL

## 2019-02-21 MED ORDER — LACTATED RINGERS IV SOLN
500.0000 mL | INTRAVENOUS | Status: DC | PRN
  Administered 2019-02-21: 1000 mL via INTRAVENOUS

## 2019-02-21 MED ORDER — FENTANYL-BUPIVACAINE-NACL 0.5-0.125-0.9 MG/250ML-% EP SOLN
EPIDURAL | Status: AC
  Filled 2019-02-21: qty 250

## 2019-02-21 MED ORDER — SOD CITRATE-CITRIC ACID 500-334 MG/5ML PO SOLN: 30.0000 mL | ORAL | Status: DC | PRN

## 2019-02-21 MED ORDER — ONDANSETRON HCL 4 MG/2ML IJ SOLN: 4.0000 mg | Freq: Four times a day (QID) | INTRAMUSCULAR | Status: DC | PRN

## 2019-02-21 MED ORDER — PRENATAL MULTIVITAMIN CH
1.0000 | ORAL_TABLET | Freq: Every day | ORAL | Status: DC
  Administered 2019-02-22 – 2019-02-23 (×2): 1 via ORAL
  Filled 2019-02-21 (×2): qty 1

## 2019-02-21 MED ORDER — SIMETHICONE 80 MG PO CHEW: 80.0000 mg | CHEWABLE_TABLET | ORAL | Status: DC | PRN

## 2019-02-21 MED ORDER — TETANUS-DIPHTH-ACELL PERTUSSIS 5-2.5-18.5 LF-MCG/0.5 IM SUSP: 0.5000 mL | Freq: Once | INTRAMUSCULAR | Status: DC

## 2019-02-21 MED ORDER — ZOLPIDEM TARTRATE 5 MG PO TABS: 5.0000 mg | ORAL_TABLET | Freq: Every evening | ORAL | Status: DC | PRN

## 2019-02-21 MED ORDER — FLUTICASONE PROPIONATE 50 MCG/ACT NA SUSP
2.0000 | Freq: Every day | NASAL | Status: DC
  Filled 2019-02-21: qty 16

## 2019-02-21 MED ORDER — FENTANYL-BUPIVACAINE-NACL 0.5-0.125-0.9 MG/250ML-% EP SOLN: 12.0000 mL/h | EPIDURAL | Status: DC | PRN

## 2019-02-21 MED ORDER — LEVOCETIRIZINE DIHYDROCHLORIDE 5 MG PO TABS: 5.0000 mg | ORAL_TABLET | Freq: Every evening | ORAL | Status: DC

## 2019-02-21 MED ORDER — IBUPROFEN 600 MG PO TABS
600.0000 mg | ORAL_TABLET | Freq: Four times a day (QID) | ORAL | Status: DC
  Administered 2019-02-21 – 2019-02-23 (×8): 600 mg via ORAL
  Filled 2019-02-21 (×8): qty 1

## 2019-02-21 MED ORDER — LIDOCAINE HCL (PF) 1 % IJ SOLN
INTRAMUSCULAR | Status: DC | PRN
  Administered 2019-02-21: 4 mL via EPIDURAL
  Administered 2019-02-21: 5 mL via EPIDURAL

## 2019-02-21 MED ORDER — DIPHENHYDRAMINE HCL 50 MG/ML IJ SOLN: 12.5000 mg | INTRAMUSCULAR | Status: DC | PRN

## 2019-02-21 MED ORDER — ONDANSETRON HCL 4 MG/2ML IJ SOLN: 4.0000 mg | INTRAMUSCULAR | Status: DC | PRN

## 2019-02-21 NOTE — Anesthesia Procedure Notes (Signed)
Epidural Patient location during procedure: OB Start time: 02/21/2019 8:31 AM End time: 02/21/2019 8:35 AM  Staffing Anesthesiologist: Audry Pili, MD Performed: anesthesiologist   Preanesthetic Checklist Completed: patient identified, pre-op evaluation, timeout performed, IV checked, risks and benefits discussed and monitors and equipment checked  Epidural Patient position: sitting Prep: DuraPrep Patient monitoring: continuous pulse ox and blood pressure Approach: midline Location: L3-L4 Injection technique: LOR saline  Needle:  Needle type: Tuohy  Needle gauge: 17 G Needle length: 9 cm Needle insertion depth: 6 cm Catheter size: 19 Gauge Catheter at skin depth: 11 cm Test dose: negative and Other (1% lidocaine)  Assessment Events: blood not aspirated  Additional Notes Patient identified. Risks including, but not limited to, bleeding, infection, nerve damage, paralysis, inadequate analgesia, blood pressure changes, nausea, vomiting, allergic reaction, postpartum back pain, itching, and headache were discussed. Patient expressed understanding and wished to proceed. Sterile prep and drape, including hand hygiene, mask, and sterile gloves were used. The patient was positioned and the spine was prepped. The skin was anesthetized with lidocaine. No paraesthesia or other complication noted. The patient did not experience any signs of intravascular injection such as tinnitus or metallic taste in mouth, nor signs of intrathecal spread such as rapid motor block. Please see nursing notes for vital signs. The patient tolerated the procedure well.   Renold Don, MDReason for block:procedure for pain

## 2019-02-21 NOTE — H&P (Signed)
Lori Miranda is a 28 y.o. female presenting for labor sxs.  Admitted in labor and now with SROM and epidural.  Pregnancy uncomplicated GSB-. OB History    Gravida  2   Para      Term      Preterm      AB  1   Living        SAB      TAB      Ectopic  1   Multiple      Live Births             Past Medical History:  Diagnosis Date  . Heart murmur    benign- since child  . Ovarian cyst    Past Surgical History:  Procedure Laterality Date  . APPENDECTOMY    . DIAGNOSTIC LAPAROSCOPY WITH REMOVAL OF ECTOPIC PREGNANCY N/A 04/11/2018   Procedure: DIAGNOSTIC LAPAROSCOPY WITH REMOVAL OF ECTOPIC PREGNANCY;  Surgeon: Tyson Dense, MD;  Location: [facility];  Service: Gynecology;  Laterality: N/A;   Family History: family history includes Aneurysm in her mother; Diabetes in her mother; Healthy in her father; Hyperlipidemia in her father; Hypertension in her father and mother. Social History:  reports that she has never smoked. She has never used smokeless tobacco. She reports previous alcohol use of about 4.0 standard drinks of alcohol per week. She reports that she does not use drugs.     Maternal Diabetes: No Genetic Screening: Normal Maternal Ultrasounds/Referrals: Normal Fetal Ultrasounds or other Referrals:  None Maternal Substance Abuse:  No Significant Maternal Medications:  None Significant Maternal Lab Results:  Group B Strep negative Other Comments:  None  ROS History Dilation: 6 Effacement (%): 90 Station: -1 Exam by:: Kostantinos Tallman Blood pressure 127/65, pulse 65, temperature 97.8 F (36.6 C), temperature source Oral, resp. rate 20, height 5\' 3"  (1.6 m), weight 64.4 kg, last menstrual period 05/17/2018, SpO2 100 %. Exam Physical Exam  Prenatal labs: ABO, Rh: --/--/A NEG (09/02 0530) Antibody: POS (09/02 0530) Rubella: Immune (02/04 0000) RPR: Nonreactive (02/04 0000)  HBsAg: Negative (02/04 0000)  HIV: Non-reactive (02/04 0000)  GBS: Negative,  Negative (08/10 0000)   Assessment/Plan: IUP at term Active labor. Anticipate SVD   Luz Lex 02/21/2019, 9:12 AM

## 2019-02-21 NOTE — MAU Note (Signed)
Pt here with c/o contractions. Denies any bleeding; possibly having some leaking. Was 3 cm in the office on Monday.

## 2019-02-21 NOTE — Progress Notes (Signed)
Report called to Dr. Lynnette Caffey. Orders received for admission, epidural PRN, monitor BP for now.

## 2019-02-21 NOTE — Anesthesia Preprocedure Evaluation (Signed)
Anesthesia Evaluation  Patient identified by MRN, date of birth, ID band Patient awake    Reviewed: Allergy & Precautions, NPO status , Patient's Chart, lab work & pertinent test results  History of Anesthesia Complications Negative for: history of anesthetic complications  Airway Mallampati: II   Neck ROM: Full    Dental   Pulmonary neg pulmonary ROS,    Pulmonary exam normal        Cardiovascular Normal cardiovascular exam+ Valvular Problems/Murmurs      Neuro/Psych negative neurological ROS  negative psych ROS   GI/Hepatic negative GI ROS, Neg liver ROS,   Endo/Other  negative endocrine ROS  Renal/GU negative Renal ROS     Musculoskeletal negative musculoskeletal ROS (+)   Abdominal   Peds  Hematology negative hematology ROS (+)   Anesthesia Other Findings   Reproductive/Obstetrics (+) Pregnancy                             Anesthesia Physical Anesthesia Plan  ASA: II  Anesthesia Plan: Epidural   Post-op Pain Management:    Induction:   PONV Risk Score and Plan: 2 and Treatment may vary due to age or medical condition  Airway Management Planned: Natural Airway  Additional Equipment: None  Intra-op Plan:   Post-operative Plan:   Informed Consent: I have reviewed the patients History and Physical, chart, labs and discussed the procedure including the risks, benefits and alternatives for the proposed anesthesia with the patient or authorized representative who has indicated his/her understanding and acceptance.       Plan Discussed with: Anesthesiologist  Anesthesia Plan Comments: (Labs reviewed. Platelets acceptable, patient not taking any blood thinning medications. Per RN, FHR tracing reported to be stable enough for sitting procedure. Risks and benefits discussed with patient, including PDPH, backache, epidural hematoma, failed epidural, blood pressure changes,  allergic reaction, and nerve injury. Patient expressed understanding and wished to proceed.)        Anesthesia Quick Evaluation

## 2019-02-22 LAB — CBC
HCT: 33.1 % — ABNORMAL LOW (ref 36.0–46.0)
Hemoglobin: 11 g/dL — ABNORMAL LOW (ref 12.0–15.0)
MCH: 31.3 pg (ref 26.0–34.0)
MCHC: 33.2 g/dL (ref 30.0–36.0)
MCV: 94.3 fL (ref 80.0–100.0)
Platelets: 124 10*3/uL — ABNORMAL LOW (ref 150–400)
RBC: 3.51 MIL/uL — ABNORMAL LOW (ref 3.87–5.11)
RDW: 12.6 % (ref 11.5–15.5)
WBC: 10.5 10*3/uL (ref 4.0–10.5)
nRBC: 0 % (ref 0.0–0.2)

## 2019-02-22 MED ORDER — RHO D IMMUNE GLOBULIN 1500 UNIT/2ML IJ SOSY
300.0000 ug | PREFILLED_SYRINGE | Freq: Once | INTRAMUSCULAR | Status: AC
  Administered 2019-02-22: 300 ug via INTRAVENOUS
  Filled 2019-02-22: qty 2

## 2019-02-22 NOTE — Progress Notes (Signed)
Patient doing well. No complaints.  BP 105/67 (BP Location: Right Arm)   Pulse (!) 57   Temp 98 F (36.7 C) (Oral)   Resp 18   Ht 5\' 3"  (1.6 m)   Wt 64.4 kg   LMP 05/17/2018   SpO2 98%   Breastfeeding Unknown   BMI 25.15 kg/m  Results for orders placed or performed during the hospital encounter of 02/21/19 (from the past 24 hour(s))  CBC     Status: Abnormal   Collection Time: 02/21/19  3:32 PM  Result Value Ref Range   WBC 13.5 (H) 4.0 - 10.5 K/uL   RBC 3.87 3.87 - 5.11 MIL/uL   Hemoglobin 12.1 12.0 - 15.0 g/dL   HCT 36.8 36.0 - 46.0 %   MCV 95.1 80.0 - 100.0 fL   MCH 31.3 26.0 - 34.0 pg   MCHC 32.9 30.0 - 36.0 g/dL   RDW 12.3 11.5 - 15.5 %   Platelets 148 (L) 150 - 400 K/uL   nRBC 0.0 0.0 - 0.2 %  CBC     Status: Abnormal   Collection Time: 02/22/19  6:00 AM  Result Value Ref Range   WBC 10.5 4.0 - 10.5 K/uL   RBC 3.51 (L) 3.87 - 5.11 MIL/uL   Hemoglobin 11.0 (L) 12.0 - 15.0 g/dL   HCT 33.1 (L) 36.0 - 46.0 %   MCV 94.3 80.0 - 100.0 fL   MCH 31.3 26.0 - 34.0 pg   MCHC 33.2 30.0 - 36.0 g/dL   RDW 12.6 11.5 - 15.5 %   Platelets 124 (L) 150 - 400 K/uL   nRBC 0.0 0.0 - 0.2 %   Abdomen is soft and non tender Lochia is WNL  IMPRESSION: Post partum day # 1 Doing well Desires circ - parents consented Will schedule with nursery

## 2019-02-22 NOTE — Lactation Note (Signed)
This note was copied from a baby's chart. Lactation Consultation Note  Patient Name: Lori Miranda [id number] Date: 02/22/2019 Reason for consult: Follow-up assessment;Primapara;1st time breastfeeding;Other (Comment);Term;Infant weight loss(DAT (+))  21 hours old FT female who is being exclusively BF by his mother, she's a P1. Mom reported moderate breast changes during the pregnancy, she told LC she leaked at 30 weeks (just one time). Baby is having difficulty latching on due to being very sleepy. When parents called for latch assist [place] tried to wake baby up to feed but he fell right back to sleep as soon as he was put STS to mother's right breast in football position. An attempt was documented in flowsheets.  LC hand expressed some drops and showed parents how to finger feed baby. When finger feeding baby LC noticed that he had a very tight grip and would do mostly biting. The tongue also felt short, but unable to do an oral assessment at this point due to baby being asleep. Parents will check with baby's pediatrician.  Parents aware of baby's DAT (+) status and mom willing to pump to start supplementing with her EBM. [place] set up a DEBP, instructions, cleaning and storage were reviewed as well as milk storage guidelines. Mom also has a Spectra S2 DEBP at home.  Dad present and very supportive, parents had lots of questions. Reviewed normal newborn behavior, cluster feeding, feeding cues, newborn jaundice and lactogenesis II.   Feeding plan:  1. Encouraged mom to feed baby STS 8-12 times/24 hours or sooner if feeding cues are present 2. Mom will double pump every 3 hours after feedings 3. If baby does not cue by the 3 hour mark, mom will wake him up to feed and offer any amount of EBM she has available in addition to the breast  Parents reported all questions and concerns were answered, they're both aware of [place] services and will call PRN.  Maternal Data Has patient been taught Hand Expression?:  Yes Does the patient have breastfeeding experience prior to this delivery?: No  Feeding Feeding Type: Breast Fed  LATCH Score                   Interventions Interventions: Breast feeding basics reviewed;Assisted with latch;Skin to skin;Breast massage;Hand express;Breast compression;Adjust position;Support pillows;Position options;DEBP  Lactation Tools Discussed/Used Tools: Pump Breast pump type: Double-Electric Breast Pump Pump Review: Setup, frequency, and cleaning;Milk Storage Initiated by:: MPeck Date initiated:: 02/22/19   Consult Status Consult Status: Follow-up Date: 02/23/19 Follow-up type: In-patient    Lori Miranda Lori Miranda 02/22/2019, 11:57 AM

## 2019-02-22 NOTE — Anesthesia Postprocedure Evaluation (Signed)
Anesthesia Post Note  Patient: STORIE HEFFERN  Procedure(s) Performed: AN AD HOC LABOR EPIDURAL     Patient location during evaluation: Mother Baby Anesthesia Type: Epidural Level of consciousness: awake and alert and oriented Pain management: satisfactory to patient Vital Signs Assessment: post-procedure vital signs reviewed and stable Respiratory status: respiratory function stable Cardiovascular status: stable Postop Assessment: no headache, no backache, epidural receding, patient able to bend at knees, no signs of nausea or vomiting and adequate PO intake Anesthetic complications: no    Last Vitals:  Vitals:   02/22/19 0219 02/22/19 0528  BP: 110/66 105/67  Pulse: (!) 54 (!) 57  Resp: 16 18  Temp: 36.7 C 36.7 C  SpO2: 98%     Last Pain:  Vitals:   02/22/19 0532  TempSrc:   PainSc: 1    Pain Goal:                   Allianna Beaubien

## 2019-02-22 NOTE — Lactation Note (Signed)
This note was copied from a baby's chart. Lactation Consultation Note  Patient Name: Lori Miranda [id number] Date: 02/22/2019   P1, Baby 93 hours old and sleeping.  Reviewed hand expression with mother w/ drops expressed. Mother states breastfeeding feels like biting. Suggest calling  when baby cues to get assistance w/ latching and oral assessment.   No trauma at this time to nipples. Feed on demand with cues.  Goal 8-12+ times per day after first 24 hrs.  Place baby STS if not cueing.  Lactation brochure given.       Maternal Data    Feeding Feeding Type: Breast Fed  LATCH Score                   Interventions    Lactation Tools Discussed/Used     Consult Status      Carlye Grippe 02/22/2019, 9:56 AM

## 2019-02-23 DIAGNOSIS — Z3490 Encounter for supervision of normal pregnancy, unspecified, unspecified trimester: Secondary | ICD-10-CM

## 2019-02-23 LAB — RH IG WORKUP (INCLUDES ABO/RH)
ABO/RH(D): A NEG
Fetal Screen: NEGATIVE
Gestational Age(Wks): 39
Unit division: 0

## 2019-02-23 MED ORDER — IBUPROFEN 600 MG PO TABS: 600.0000 mg | ORAL_TABLET | Freq: Four times a day (QID) | ORAL | 0 refills | Status: DC | PRN

## 2019-02-23 MED ORDER — ACETAMINOPHEN 325 MG PO TABS: 650.0000 mg | ORAL_TABLET | Freq: Four times a day (QID) | ORAL | 0 refills | Status: DC | PRN

## 2019-02-23 NOTE — Lactation Note (Addendum)
This note was copied from a baby's chart. Lactation Consultation Note  Patient Name: Lori Miranda [id number] Date: 02/23/2019   "Einar Pheasant" was ready to eat s/p circ. He latched with ease. Mom did benefit from having his mandible lowered to widen his gape, so that it felt less "pinchy." Mom understands that it is normal for an infant suckling to feel like a "tug." Mom felt comfortable with latch. Parents were taught tips on alignment & how to recognize the presence of swallows, which were frequent. Specifics of an asymmetric latch were shown via [facility] website animation to demonstrate a better latch technique.  Parents were shown how to assemble & use hand pump (single- & double-mode) that was included in pump kit. Parents' questions were answered to their satisfaction. Parents know how to reach [place] post-discharge and they also live next door to an [facility], who has offered to help them, if desired.    Matthias Hug[facility] 02/23/2019, 9:12 AM

## 2019-02-23 NOTE — Lactation Note (Signed)
This note was copied from a baby's chart. Lactation Consultation Note  Patient Name: Lori Miranda [id number] Date: 02/23/2019   Infant is 90 hrs old & has just left the room to have his circ done. Parents gave me permission to remain in room to ask a few questions.   Mom says her breasts feel heavier today. Her anatomy & vein distribution suggest that she will have an abundant supply once her milk comes to volume. Dad says that breastfeeding is a "work in progress." Mom says that she feels that there is "pinching when sucking." Parents also commented that he seems to latch but then not do anything.   Mom gave me permission to teach a different hand expression technique so that she could work on expressing her milk in case infant is too sleepy to latch once he returns from his circ. Parents know to call me when ready for me to return to help with spoon feeding or latching.  Matthias Hughs [facility] 02/23/2019, 7:44 AM

## 2019-02-23 NOTE — Progress Notes (Signed)
Post Partum Day 2 Subjective: no complaints, up ad lib, voiding, tolerating PO and + flatus  Objective: Blood pressure 114/63, pulse (!) 52, temperature 98.5 F (36.9 C), temperature source Oral, resp. rate 16, height 5\' 3"  (1.6 m), weight 64.4 kg, last menstrual period 05/17/2018, SpO2 100 %, unknown if currently breastfeeding.  Physical Exam:  General: alert, cooperative and no distress Lochia: appropriate Uterine Fundus: firm Incision: healing well DVT Evaluation: No evidence of DVT seen on physical exam.  Recent Labs    02/21/19 1532 02/22/19 0600  HGB 12.1 11.0*  HCT 36.8 33.1*    Assessment/Plan: Discharge home  D/W circumcision newborn boy-risks reviewed-patient states she understands and agrees   LOS: 2 days   Shon Millet II 02/23/2019, 7:38 AM

## 2019-02-23 NOTE — Discharge Summary (Signed)
Obstetric Discharge Summary Reason for Admission: onset of labor Prenatal Procedures: none Intrapartum Procedures: vacuum Postpartum Procedures: none Complications-Operative and Postpartum: none Hemoglobin  Date Value Ref Range Status  02/22/2019 11.0 (L) 12.0 - 15.0 g/dL Final   HCT  Date Value Ref Range Status  02/22/2019 33.1 (L) 36.0 - 46.0 % Final    Physical Exam:  General: alert Lochia: appropriate Uterine Fundus: firm Incision: healing well DVT Evaluation: No evidence of DVT seen on physical exam.  Discharge Diagnoses: Term Pregnancy-delivered  Discharge Information: Date: 02/23/2019 Activity: pelvic rest Diet: routine Medications: PNV and Ibuprofen Condition: stable Instructions: refer to practice specific booklet Discharge to: home   Newborn Data: Live born female  Birth Weight: 7 lb 2.8 oz (3255 g) APGAR: 9, 9  Newborn Delivery   Birth date/time: 02/21/2019 14:26:00 Delivery type: Vaginal, Vacuum (Extractor)      Home with mother.  Lori Miranda 02/23/2019, 7:45 AM

## 2019-02-23 NOTE — Progress Notes (Signed)
AVS printed and discharge instructions given to patient. Patiens instructed to call for follow up appointment and to pick prescription. Patient verbalized understanding and has no further questions.  Patient discharge significant other in room.

## 2019-02-25 LAB — TYPE AND SCREEN
ABO/RH(D): A NEG
Antibody Screen: POSITIVE
Unit division: 0
Unit division: 0

## 2019-02-25 LAB — BPAM RBC
Blood Product Expiration Date: 202009182359
Blood Product Expiration Date: 202009192359
ISSUE DATE / TIME: 202008290853
Unit Type and Rh: 600
Unit Type and Rh: 600

## 2019-02-28 ENCOUNTER — Inpatient Hospital Stay (HOSPITAL_COMMUNITY)
Admission: RE | Admit: 2019-02-28 | Discharge: 2019-02-28 | Disposition: A | Discharge status: Home / Self Care | Payer: 59 | Source: Ambulatory Visit

## 2019-03-02 ENCOUNTER — Inpatient Hospital Stay (HOSPITAL_COMMUNITY): Payer: 59

## 2019-04-04 DIAGNOSIS — Z23 Encounter for immunization: Secondary | ICD-10-CM | POA: Diagnosis not present

## 2019-04-04 DIAGNOSIS — Z124 Encounter for screening for malignant neoplasm of cervix: Secondary | ICD-10-CM | POA: Diagnosis not present

## 2019-04-04 DIAGNOSIS — Z01419 Encounter for gynecological examination (general) (routine) without abnormal findings: Secondary | ICD-10-CM | POA: Diagnosis not present

## 2019-04-04 DIAGNOSIS — Z1389 Encounter for screening for other disorder: Secondary | ICD-10-CM | POA: Diagnosis not present

## 2020-11-14 ENCOUNTER — Inpatient Hospital Stay (HOSPITAL_COMMUNITY)
Admission: AD | Admit: 2020-11-14 | Discharge: 2020-11-14 | Disposition: A | Discharge status: Home / Self Care | Payer: 59 | Attending: Obstetrics and Gynecology | Admitting: Obstetrics and Gynecology

## 2020-11-14 ENCOUNTER — Encounter (HOSPITAL_COMMUNITY): Payer: Self-pay | Admitting: Obstetrics and Gynecology

## 2020-11-14 ENCOUNTER — Inpatient Hospital Stay (HOSPITAL_COMMUNITY): Payer: 59

## 2020-11-14 ENCOUNTER — Other Ambulatory Visit: Payer: Self-pay

## 2020-11-14 DIAGNOSIS — Z3A09 9 weeks gestation of pregnancy: Secondary | ICD-10-CM | POA: Insufficient documentation

## 2020-11-14 DIAGNOSIS — Z90721 Acquired absence of ovaries, unilateral: Secondary | ICD-10-CM | POA: Diagnosis not present

## 2020-11-14 DIAGNOSIS — O021 Missed abortion: Secondary | ICD-10-CM | POA: Diagnosis not present

## 2020-11-14 DIAGNOSIS — Z6791 Unspecified blood type, Rh negative: Secondary | ICD-10-CM | POA: Diagnosis not present

## 2020-11-14 DIAGNOSIS — R109 Unspecified abdominal pain: Secondary | ICD-10-CM | POA: Diagnosis present

## 2020-11-14 DIAGNOSIS — O4690 Antepartum hemorrhage, unspecified, unspecified trimester: Secondary | ICD-10-CM

## 2020-11-14 LAB — URINALYSIS, ROUTINE W REFLEX MICROSCOPIC
Bilirubin Urine: NEGATIVE
Glucose, UA: NEGATIVE mg/dL
Ketones, ur: 5 mg/dL — AB
Leukocytes,Ua: NEGATIVE
Nitrite: NEGATIVE
Protein, ur: NEGATIVE mg/dL
Specific Gravity, Urine: 1.021 (ref 1.005–1.030)
pH: 6 (ref 5.0–8.0)

## 2020-11-14 LAB — POCT PREGNANCY, URINE: Preg Test, Ur: POSITIVE — AB

## 2020-11-14 MED ORDER — OXYCODONE-ACETAMINOPHEN 5-325 MG PO TABS: 1.0000 | ORAL_TABLET | ORAL | 0 refills | Status: DC | PRN

## 2020-11-14 MED ORDER — PROMETHAZINE HCL 25 MG PO TABS: 12.5000 mg | ORAL_TABLET | Freq: Four times a day (QID) | ORAL | 0 refills | Status: DC | PRN

## 2020-11-14 MED ORDER — RHO D IMMUNE GLOBULIN 1500 UNIT/2ML IJ SOSY
300.0000 ug | PREFILLED_SYRINGE | Freq: Once | INTRAMUSCULAR | Status: AC
  Administered 2020-11-14: 300 ug via INTRAMUSCULAR
  Filled 2020-11-14: qty 2

## 2020-11-14 MED ORDER — IBUPROFEN 600 MG PO TABS: 600.0000 mg | ORAL_TABLET | Freq: Four times a day (QID) | ORAL | 0 refills | Status: DC | PRN

## 2020-11-14 MED ORDER — MISOPROSTOL 200 MCG PO TABS: 800.0000 ug | ORAL_TABLET | Freq: Once | ORAL | 0 refills | Status: DC

## 2020-11-14 NOTE — Discharge Instructions (Signed)
Miscarriage A miscarriage is the loss of pregnancy before the 20th week. Most miscarriages happen during the first 3 months of pregnancy. Sometimes, a miscarriage can happen before a woman knows that she is pregnant. Having a miscarriage can be an emotional experience. If you have had a miscarriage, talk with your health care provider about any questions you may have about the loss of your baby, the grieving process, and your plans for future pregnancy. What are the causes? Many times, the cause of a miscarriage is not known. What increases the risk? The following factors may make a pregnant woman more likely to have a miscarriage: Certain medical conditions  Conditions that affect the hormone balance in the body, such as thyroid disease or polycystic ovary syndrome.  Diabetes.  Autoimmune disorders.  Infections.  Bleeding disorders.  Obesity. Lifestyle factors  Using products with tobacco or nicotine in them or being exposed to tobacco smoke.  Having alcohol.  Having large amounts of caffeine.  Recreational drug use. Problems with reproductive organs or structures  Cervical insufficiency. This is when the lowest part of the uterus (cervix) opens and thins before pregnancy is at term.  Having a condition called Asherman syndrome. This syndrome causes scarring in the uterus or causes the uterus to be abnormal in structure.  Fibrous growths, called fibroids, in the uterus.  Congenital abnormalities. These problems are present at birth.  Infection of the cervix or uterus. Personal or medical history  Injury (trauma).  Having had a miscarriage before.  Being younger than age 18 or older than age 35.  Exposure to harmful substances in the environment. This may include radiation or heavy metals, such as lead.  Use of certain medicines. What are the signs or symptoms? Symptoms of this condition include:  Vaginal bleeding or spotting, with or without cramps or  pain.  Pain or cramping in the abdomen or lower back.  Fluid or tissue coming out of the vagina. How is this diagnosed? This condition may be diagnosed based on:  A physical exam.  Ultrasound.  Lab tests, such as blood tests, urine tests, or swabs for infection. How is this treated? Treatment for a miscarriage is sometimes not needed if all the pregnancy tissue that was in the uterus comes out on its own, and there are no other problems such as infection or heavy bleeding. In other cases, this condition may be treated with:  Dilation and curettage (D&C). In this procedure, the cervix is stretched open and any remaining pregnancy tissue is removed from the lining of the uterus (endometrium).  Medicines. These may include: ? Antibiotic medicine, to treat infection. ? Medicine to help any remaining pregnancy tissue come out of the body. ? Medicine to reduce (contract) the size of the uterus. These medicines may be given if there is a lot of bleeding. If you have Rh-negative blood, you may be given an injection of a medicine called Rho(D) immune globulin. This medicine helps prevent problems with future pregnancies. Follow these instructions at home: Medicines  Take over-the-counter and prescription medicines only as told by your health care provider.  If you were prescribed antibiotic medicine, take it as told by your health care provider. Do not stop taking the antibiotic even if you start to feel better. Activity  Rest as told by your health care provider. Ask your health care provider what activities are safe for you.  Have someone help with home and family responsibilities during this time. General instructions  Monitor how much tissue   or blood clot material comes out of the vagina.  Do not have sex, douche, or put anything, such as tampons, in your vagina until your health care provider says it is okay.  To help you and your partner with the grieving process, talk with your  health care provider or get counseling.  When you are ready, meet with your health care provider to discuss any important steps you should take for your health. Also, discuss steps you should take to have a healthy pregnancy in the future.  Keep all follow-up visits. This is important.   Where to find more information  The American College of Obstetricians and Gynecologists: acog.org  [place] Department of Health and Human Services Office of Women's Health: hrsa.gov/office-womens-health Contact a health care provider if:  You have a fever or chills.  There is bad-smelling fluid coming from the vagina.  You have more bleeding instead of less.  Tissue or blood clots come out of your vagina. Get help right away if:  You have severe cramps or pain in your back or abdomen.  Heavy bleeding soaks through 2 large sanitary pads an hour for more than 2 hours.  You become light-headed or weak.  You faint.  You feel sad, and your sadness takes over your thoughts.  You think about hurting yourself. If you ever feel like you may hurt yourself or others, or have thoughts about taking your own life, get help right away. Go to your nearest emergency department or:  Call your local emergency services (911 in the [place]  Call a suicide crisis helpline, such as the National Suicide Prevention Lifeline at 1-800-273-8255. This is open 24 hours a day in the [place]  Text the Crisis Text Line at 741741 (in the [place] Summary  Most miscarriages happen in the first 3 months of pregnancy. Sometimes miscarriage happens before a woman knows that she is pregnant.  Follow instructions from your health care provider about medicines and activity.  To help you and your partner with grieving, talk with your health care provider or get counseling.  Keep all follow-up visits. This information is not intended to replace advice given to you by your health care provider. Make sure you discuss any questions you  have with your health care provider. Document Revised: 12/07/2019 Document Reviewed: 12/07/2019 Elsevier Patient Education  2021 Elsevier Inc.  

## 2020-11-14 NOTE — MAU Note (Signed)
Presents with c/o mild abdominal cramping and spotting that began yesterday evening.  Reports spotting has increased today.  LMP 08/30/2020

## 2020-11-14 NOTE — MAU Provider Note (Signed)
History     CSN: [id number]  Arrival date and time: 11/14/20 1658   Event Date/Time   First Provider Initiated Contact with Patient 11/14/20 1834      Chief Complaint  Patient presents with  . Abdominal Pain  . Spotting   30 y.o. [id number] @[redacted]w[redacted]d  presenting with spotting and cramping. Reports onset of tan discharge last night. Today it became more and pink. Mostly sees when wiping but some on underwear. No recent sex. Cramping started today. Initially was all quadrants but now is bilateral in lower quadrants.  Rates pain 2/10. Had 4/10 in office that showed viable IUP.    OB History    Gravida  3   Para  1   Term  1   Preterm      AB  1   Living  1     SAB      IAB      Ectopic  1   Multiple  0   Live Births  1           Past Medical History:  Diagnosis Date  . Heart murmur    benign- since child  . Ovarian cyst     Past Surgical History:  Procedure Laterality Date  . APPENDECTOMY    . DIAGNOSTIC LAPAROSCOPY WITH REMOVAL OF ECTOPIC PREGNANCY N/A 04/11/2018   Procedure: DIAGNOSTIC LAPAROSCOPY WITH REMOVAL OF ECTOPIC PREGNANCY;  Surgeon: 04/13/2018, MD;  Location: [facility];  Service: Gynecology;  Laterality: N/A;    Family History  Problem Relation Age of Onset  . Hypertension Mother   . Diabetes Mother   . Aneurysm Mother        brain  . Healthy Father   . Hypertension Father   . Hyperlipidemia Father     Social History   Tobacco Use  . Smoking status: Never Smoker  . Smokeless tobacco: Never Used  Vaping Use  . Vaping Use: Never used  Substance Use Topics  . Alcohol use: Not Currently    Alcohol/week: 4.0 standard drinks    Types: 4 Cans of beer per week  . Drug use: No    Allergies: No Known Allergies  No medications prior to admission.    Review of Systems  Gastrointestinal: Positive for abdominal pain.  Genitourinary: Positive for vaginal bleeding.   Physical Exam   Blood pressure (!) 146/84, pulse 78,  temperature 98.1 F (36.7 C), temperature source Oral, resp. rate 16, height 5\' 3"  (1.6 m), weight 51.8 kg, last menstrual period 08/30/2020, SpO2 100 %, unknown if currently breastfeeding. Patient Vitals for the past 24 hrs:  BP Temp Temp src Pulse Resp SpO2 Height Weight  11/14/20 2030 (!) 146/84 -- -- 78 -- -- -- --  11/14/20 2021 131/78 -- -- 87 16 100 % -- --  11/14/20 1730 131/78 98.1 F (36.7 C) Oral 75 18 100 % -- --  11/14/20 1725 -- -- -- -- -- -- 5\' 3"  (1.6 m) 51.8 kg   Physical Exam Vitals and nursing note reviewed.  Constitutional:      General: She is not in acute distress.    Appearance: Normal appearance.  HENT:     Head: Normocephalic and atraumatic.  Cardiovascular:     Rate and Rhythm: Normal rate.  Pulmonary:     Effort: Pulmonary effort is normal. No respiratory distress.  Abdominal:     General: There is no distension.     Palpations: Abdomen is soft. There is no mass.  Tenderness: There is no abdominal tenderness. There is no guarding or rebound.     Hernia: No hernia is present.  Musculoskeletal:        General: Normal range of motion.     Cervical back: Normal range of motion.  Skin:    General: Skin is warm and dry.  Neurological:     General: No focal deficit present.     Mental Status: She is alert and oriented to person, place, and time.  Psychiatric:        Mood and Affect: Mood normal.        Behavior: Behavior normal.    Results for orders placed or performed during the hospital encounter of 11/14/20 (from the past 24 hour(s))  Pregnancy, urine POC     Status: Abnormal   Collection Time: 11/14/20  5:26 PM  Result Value Ref Range   Preg Test, Ur POSITIVE (A) NEGATIVE  Urinalysis, Routine w reflex microscopic Urine, Clean Catch     Status: Abnormal   Collection Time: 11/14/20  5:28 PM  Result Value Ref Range   Color, Urine YELLOW YELLOW   APPearance CLOUDY (A) CLEAR   Specific Gravity, Urine 1.021 1.005 - 1.030   pH 6.0 5.0 - 8.0    Glucose, UA NEGATIVE NEGATIVE mg/dL   Hgb urine dipstick MODERATE (A) NEGATIVE   Bilirubin Urine NEGATIVE NEGATIVE   Ketones, ur 5 (A) NEGATIVE mg/dL   Protein, ur NEGATIVE NEGATIVE mg/dL   Nitrite NEGATIVE NEGATIVE   Leukocytes,Ua NEGATIVE NEGATIVE   RBC / HPF 11-20 0 - 5 RBC/hpf   WBC, UA 0-5 0 - 5 WBC/hpf   Bacteria, UA RARE (A) NONE SEEN   Squamous Epithelial / LPF 0-5 0 - 5  Rh IG workup (includes ABO/Rh)     Status: None (Preliminary result)   Collection Time: 11/14/20  6:39 PM  Result Value Ref Range   Gestational Age(Wks) 6    Antibody Screen NEG    Unit Number [id number]    Blood Component Type RHIG    Unit division 00    Status of Unit ISSUED    Transfusion Status      OK TO TRANSFUSE Performed at [facility], [address] [place], [place] [address]    US OB LESS THAN 14 WEEKS WITH OB TRANSVAGINAL  Result Date: 11/14/2020 CLINICAL DATA:  Pelvic pain EXAM: OBSTETRIC <14 WK [place] AND TRANSVAGINAL OB US TECHNIQUE: Both transabdominal and transvaginal ultrasound examinations were performed for complete evaluation of the gestation as well as the maternal uterus, adnexal regions, and pelvic cul-de-sac. Transvaginal technique was performed to assess early pregnancy. COMPARISON:  None. FINDINGS: Intrauterine gestational sac: Present Yolk sac:  Present Embryo:  Present Cardiac Activity: Absent CRL:  13.6 mm   7 w   4 d Subchorionic hemorrhage:  None visualized. Maternal uterus/adnexae: Right ovary is not well visualized. Left ovary has been surgically removed during ectopic pregnancy surgery. No free fluid is noted. IMPRESSION: Single intrauterine gestation with absent fetal heartbeat consistent with intrauterine demise. Electronically Signed   By: Alcide Clever M.D.   On: 11/14/2020 19:34   MAU Course  Procedures Rhogam  MDM Labs and [place] ordered and reviewed. IUFD measuring 7 wks identified on [place]. Discussed findings with pt, condolences given. Discussed presentation and  findings with Dr. Vincente Poli, ok to offer medical or expectant mngt. Pt prefers medical mngt. Pt informed medication may not work, could cause more intense pain, and may still need to have  a surgical procedure. Pt wishes to proceed.  Early Intrauterine Pregnancy Failure Protocol X  Documented intrauterine pregnancy failure less than or equal to [redacted] weeks gestation  X  No serious current illness  X  Baseline Hgb greater than or equal to 10g/dl >(99.3 per office result 11/05/20) X  Patient has easily accessible transportation to the hospital  X  Clear preference  X  Practitioner/physician deems patient reliable  X  Counseling by practitioner or physician  X  Rho-Gam given by RN if indicated  X  Medication dispensed  X  Rx Cytotec 800 mcg po x1 X  Rx Ibuprofen 600 mg 1 tablet by mouth every 6 hours as needed - prescribed  X  Rx Percocet 5/325 1-2 tabs every 6 hours as needed - prescribed  X  Rx Phenergan 25 mg by mouth every 6 hours as needed for nausea - prescribed  Reviewed with pt cytotec procedure. Pt verbalizes that she lives close to the hospital and has transportation readily available. Pt appears reliable and verbalizes understanding and agrees with plan of care.  Assessment and Plan   1. Missed abortion   2. Vaginal bleeding in pregnancy   3. Rh negative status during pregnancy in first trimester    Discharge home Follow up at [facility] 11/18/20- pt to call office Bleeding/pain return precautions  Allergies as of 11/14/2020   No Known Allergies     Medication List    STOP taking these medications   fluticasone 50 MCG/ACT nasal spray Commonly known as: FLONASE     TAKE these medications   acetaminophen 325 MG tablet Commonly known as: Tylenol Take 2 tablets (650 mg total) by mouth every 6 (six) hours as needed (for pain scale < 4).   ibuprofen 600 MG tablet Commonly known as: ADVIL Take 1 tablet (600 mg total) by mouth every 6 (six) hours as needed.    misoprostol 200 MCG tablet Commonly known as: Cytotec Take 4 tablets (800 mcg total) by mouth once for 1 dose.   multivitamin-prenatal 27-0.8 MG Tabs tablet Take 1 tablet by mouth daily at 12 noon.   oxyCODONE-acetaminophen 5-325 MG tablet Commonly known as: PERCOCET/ROXICET Take 1 tablet by mouth every 4 (four) hours as needed for severe pain.   promethazine 25 MG tablet Commonly known as: PHENERGAN Take 0.5-1 tablets (12.5-25 mg total) by mouth every 6 (six) hours as needed for nausea or vomiting.      Donette Larry, CNM 11/14/2020, 9:43 PM

## 2020-11-15 LAB — RH IG WORKUP (INCLUDES ABO/RH)
Antibody Screen: NEGATIVE
Gestational Age(Wks): 6
Unit division: 0

## 2020-11-25 ENCOUNTER — Encounter (HOSPITAL_COMMUNITY): Payer: Self-pay | Admitting: Obstetrics and Gynecology

## 2020-11-25 ENCOUNTER — Other Ambulatory Visit: Payer: Self-pay

## 2020-11-25 NOTE — Anesthesia Preprocedure Evaluation (Addendum)
Anesthesia Evaluation  Patient identified by MRN, date of birth, ID band Patient awake    Reviewed: Allergy & Precautions, NPO status , Patient's Chart, lab work & pertinent test results  History of Anesthesia Complications (+) PONV  Airway Mallampati: II  TM Distance: >3 FB Neck ROM: Full    Dental  (+) Teeth Intact   Pulmonary asthma ,    Pulmonary exam normal        Cardiovascular negative cardio ROS   Rhythm:Regular Rate:Normal     Neuro/Psych negative neurological ROS  negative psych ROS   GI/Hepatic negative GI ROS, Neg liver ROS,   Endo/Other  negative endocrine ROS  Renal/GU negative Renal ROS  Female GU complaint Retained products     Musculoskeletal negative musculoskeletal ROS (+)   Abdominal (+)  Abdomen: soft. Bowel sounds: normal.  Peds  Hematology negative hematology ROS (+)   Anesthesia Other Findings   Reproductive/Obstetrics                            Anesthesia Physical Anesthesia Plan  ASA: II  Anesthesia Plan: General   Post-op Pain Management:    Induction: Intravenous  PONV Risk Score and Plan: 4 or greater and Ondansetron, Dexamethasone, Midazolam, Scopolamine patch - Pre-op, Treatment may vary due to age or medical condition and Aprepitant  Airway Management Planned: Mask and LMA  Additional Equipment: None  Intra-op Plan:   Post-operative Plan: Extubation in OR  Informed Consent: I have reviewed the patients History and Physical, chart, labs and discussed the procedure including the risks, benefits and alternatives for the proposed anesthesia with the patient or authorized representative who has indicated his/her understanding and acceptance.     Dental advisory given  Plan Discussed with: CRNA  Anesthesia Plan Comments: (Lab Results      Component                Value               Date                      WBC                      5.1                  11/26/2020                HGB                      13.8                11/26/2020                HCT                      43.2                11/26/2020                MCV                      98.2                11/26/2020                PLT  291                 11/26/2020           Lab Results      Component                Value               Date                      NA                       137                 03/01/2016                K                        3.9                 03/01/2016                CO2                      28                  03/01/2016                GLUCOSE                  114 (H)             03/01/2016                BUN                      16                  03/01/2016                CREATININE               0.70                03/01/2016                CALCIUM                  9.5                 03/01/2016          )       Anesthesia Quick Evaluation

## 2020-11-25 NOTE — H&P (Signed)
Lori Miranda is an 30 y.o. female. Presenting for surgical management of missed abortion with retainde products of conception. She had a normal initial pregnancy ultrasound. She then experienced bleeding and was found to have a missed miscarriage (No FHTs). She was given Cytotec and rhogam and passed what she was certain was the pregnancy with a sac. HCG value was drawn a few days after that and was 4670. Thus, an ultrasound was performed and notable for suspected rPOCs with thickened inhomogeneous endometrium with increased vascularity.  Pertinent Gynecological History: Menses: flow is moderate Bleeding: n/a Contraception: none DES exposure: denies Blood transfusions: none Sexually transmitted diseases: no past history Previous GYN Procedures: none  Last mammogram: n/a Last pap: normal OB History: G3, P1021 (G1: ovarian ectopic s/p LSO, G2: VAVD)   Menstrual History: Patient's last menstrual period was 08/30/2020.    Past Medical History:  Diagnosis Date  . Heart murmur    benign- since child  . Ovarian cyst     Past Surgical History:  Procedure Laterality Date  . APPENDECTOMY    . DIAGNOSTIC LAPAROSCOPY WITH REMOVAL OF ECTOPIC PREGNANCY N/A 04/11/2018   Procedure: DIAGNOSTIC LAPAROSCOPY WITH REMOVAL OF ECTOPIC PREGNANCY;  Surgeon: Ranae Pila, MD;  Location: [facility];  Service: Gynecology;  Laterality: N/A;    Family History  Problem Relation Age of Onset  . Hypertension Mother   . Diabetes Mother   . Aneurysm Mother        brain  . Healthy Father   . Hypertension Father   . Hyperlipidemia Father     Social History:  reports that she has never smoked. She has never used smokeless tobacco. She reports previous alcohol use of about 4.0 standard drinks of alcohol per week. She reports that she does not use drugs.  Allergies: No Known Allergies  No medications prior to admission.    Review of Systems  Last menstrual period 08/30/2020, unknown if currently  breastfeeding. Physical Exam  Gen: well appearing, NAD CV: Reg rate Pulm: NWOB Abd: soft, nondistended, nontender, no masses GYN: uterus 7 week size, no adnexa ttp/CMT Ext: No edema b/l  No results found for this or any previous visit (from the past 24 hour(s)).  No results found.  Assessment/Plan: Pt counseled on TVUS results and diagnosis of MAB and suspected rPOC. Discussed that it is unlikely to be her fault nor could she have prevented it. Reviewed that this miscarriage does not likely reflect her ability to have a successful pregnancy in the future, and that miscarriage is common - 1:5 pregnancies. She was counseled on options for managing missed ab including expectant, medical, and surgical management.  Patient desires definitive surgical management with suction dilation and curettage.  Risks/benefits/ and alternatives reviewed with patient with risks including but not limited to bleeding, infection, uterine perforation, and damage to nearby structures such as the bowel, bladder, vessels, and/or other organs. She was given opportunity to ask questions and all questions answered. Patient consents to proceed with suction dilation and curettage. Reviewed bleeding precautions. Her blood type is A negative and she does not need Rhogam bc it was given two weeks ago Given instructions and she verbalizes understanding. Discussed the importance of having at least one normal periods after completion of the process, before attempting conception again. Discussed S/S to call back for. All questions answered and pt verbalizes understanding w/out further questions/concerns. Risks discussed including infection, bleeding, damage to surrounding structures, need for additional procedures, postoperative DVT and subsequent scarring. All questions  answered. Consent signed in office.  Plan on doxycycline postop and consider cytotec x 3 days. This rx was sent already.    Lori Miranda 11/25/2020, 4:06 PM

## 2020-11-25 NOTE — Progress Notes (Signed)
Spoke with pt for pre-op call. Pt has hx of a heart murmur but states it's benign. Pt is not diabetic.   Pt's surgery is scheduled as ambulatory so no Covid test is required prior to surgery. Pt denies any Covid symptoms

## 2020-11-26 ENCOUNTER — Ambulatory Visit (HOSPITAL_COMMUNITY)
Admission: RE | Admit: 2020-11-26 | Discharge: 2020-11-26 | Disposition: A | Discharge status: Home / Self Care | Payer: 59 | Attending: Obstetrics and Gynecology | Admitting: Obstetrics and Gynecology

## 2020-11-26 ENCOUNTER — Encounter (HOSPITAL_COMMUNITY): Payer: Self-pay | Admitting: Obstetrics and Gynecology

## 2020-11-26 ENCOUNTER — Other Ambulatory Visit: Payer: Self-pay

## 2020-11-26 ENCOUNTER — Ambulatory Visit (HOSPITAL_COMMUNITY): Payer: 59

## 2020-11-26 ENCOUNTER — Encounter (HOSPITAL_COMMUNITY)
Admission: RE | Disposition: A | Discharge status: Home / Self Care | Payer: Self-pay | Source: Home / Self Care | Attending: Obstetrics and Gynecology

## 2020-11-26 ENCOUNTER — Ambulatory Visit (HOSPITAL_COMMUNITY): Payer: 59 | Admitting: Anesthesiology

## 2020-11-26 DIAGNOSIS — O021 Missed abortion: Secondary | ICD-10-CM | POA: Insufficient documentation

## 2020-11-26 DIAGNOSIS — O034 Incomplete spontaneous abortion without complication: Secondary | ICD-10-CM

## 2020-11-26 HISTORY — PX: OPERATIVE ULTRASOUND: SHX5996

## 2020-11-26 HISTORY — PX: DILATION AND EVACUATION: SHX1459

## 2020-11-26 HISTORY — DX: Other specified postprocedural states: Z98.890

## 2020-11-26 HISTORY — DX: Unspecified asthma, uncomplicated: J45.909

## 2020-11-26 HISTORY — DX: Nausea with vomiting, unspecified: R11.2

## 2020-11-26 HISTORY — DX: Pneumonia, unspecified organism: J18.9

## 2020-11-26 LAB — CBC
HCT: 43.2 % (ref 36.0–46.0)
Hemoglobin: 13.8 g/dL (ref 12.0–15.0)
MCH: 31.4 pg (ref 26.0–34.0)
MCHC: 31.9 g/dL (ref 30.0–36.0)
MCV: 98.2 fL (ref 80.0–100.0)
Platelets: 291 10*3/uL (ref 150–400)
RBC: 4.4 MIL/uL (ref 3.87–5.11)
RDW: 12 % (ref 11.5–15.5)
WBC: 5.1 10*3/uL (ref 4.0–10.5)
nRBC: 0 % (ref 0.0–0.2)

## 2020-11-26 LAB — TYPE AND SCREEN
ABO/RH(D): A NEG
Antibody Screen: POSITIVE

## 2020-11-26 LAB — RPR: RPR Ser Ql: NONREACTIVE

## 2020-11-26 SURGERY — DILATION AND EVACUATION, UTERUS
Anesthesia: Choice

## 2020-11-26 SURGERY — DILATION AND EVACUATION, UTERUS
Anesthesia: General | Site: Vagina

## 2020-11-26 MED ORDER — PROPOFOL 10 MG/ML IV BOLUS
INTRAVENOUS | Status: AC
  Filled 2020-11-26: qty 40

## 2020-11-26 MED ORDER — FENTANYL CITRATE (PF) 100 MCG/2ML IJ SOLN: 25.0000 ug | INTRAMUSCULAR | Status: DC | PRN

## 2020-11-26 MED ORDER — POVIDONE-IODINE 10 % EX SWAB
2.0000 "application " | Freq: Once | CUTANEOUS | Status: AC
  Administered 2020-11-26: 2 via TOPICAL

## 2020-11-26 MED ORDER — DEXAMETHASONE SODIUM PHOSPHATE 10 MG/ML IJ SOLN
INTRAMUSCULAR | Status: DC | PRN
  Administered 2020-11-26: 10 mg via INTRAVENOUS

## 2020-11-26 MED ORDER — PROPOFOL 10 MG/ML IV BOLUS
INTRAVENOUS | Status: DC | PRN
  Administered 2020-11-26: 70 mg via INTRAVENOUS
  Administered 2020-11-26: 100 mg via INTRAVENOUS
  Administered 2020-11-26: 30 mg via INTRAVENOUS

## 2020-11-26 MED ORDER — MIDAZOLAM HCL 5 MG/5ML IJ SOLN
INTRAMUSCULAR | Status: DC | PRN
  Administered 2020-11-26: 2 mg via INTRAVENOUS

## 2020-11-26 MED ORDER — LACTATED RINGERS IV SOLN: INTRAVENOUS | Status: DC

## 2020-11-26 MED ORDER — ACETAMINOPHEN 10 MG/ML IV SOLN: 1000.0000 mg | Freq: Once | INTRAVENOUS | Status: DC | PRN

## 2020-11-26 MED ORDER — PROMETHAZINE HCL 25 MG/ML IJ SOLN: 6.2500 mg | INTRAMUSCULAR | Status: DC | PRN

## 2020-11-26 MED ORDER — SCOPOLAMINE 1 MG/3DAYS TD PT72
1.0000 | MEDICATED_PATCH | TRANSDERMAL | Status: DC
  Administered 2020-11-26: 1.5 mg via TRANSDERMAL
  Filled 2020-11-26: qty 1

## 2020-11-26 MED ORDER — APREPITANT 40 MG PO CAPS
40.0000 mg | ORAL_CAPSULE | Freq: Once | ORAL | Status: AC
  Administered 2020-11-26: 40 mg via ORAL
  Filled 2020-11-26: qty 1

## 2020-11-26 MED ORDER — MIDAZOLAM HCL 2 MG/2ML IJ SOLN
INTRAMUSCULAR | Status: AC
  Filled 2020-11-26: qty 2

## 2020-11-26 MED ORDER — FENTANYL CITRATE (PF) 250 MCG/5ML IJ SOLN
INTRAMUSCULAR | Status: AC
  Filled 2020-11-26: qty 5

## 2020-11-26 MED ORDER — LIDOCAINE 2% (20 MG/ML) 5 ML SYRINGE
INTRAMUSCULAR | Status: DC | PRN
  Administered 2020-11-26: 60 mg via INTRAVENOUS

## 2020-11-26 MED ORDER — ONDANSETRON HCL 4 MG/2ML IJ SOLN
INTRAMUSCULAR | Status: DC | PRN
  Administered 2020-11-26: 4 mg via INTRAVENOUS

## 2020-11-26 MED ORDER — FENTANYL CITRATE (PF) 250 MCG/5ML IJ SOLN
INTRAMUSCULAR | Status: DC | PRN
  Administered 2020-11-26: 25 ug via INTRAVENOUS
  Administered 2020-11-26: 50 ug via INTRAVENOUS

## 2020-11-26 MED ORDER — CHLORHEXIDINE GLUCONATE 0.12 % MT SOLN
15.0000 mL | Freq: Once | OROMUCOSAL | Status: AC
Start: 2020-11-26 — End: 2020-11-26
  Administered 2020-11-26: 15 mL via OROMUCOSAL
  Filled 2020-11-26: qty 15

## 2020-11-26 MED ORDER — ORAL CARE MOUTH RINSE: 15.0000 mL | Freq: Once | OROMUCOSAL | Status: AC

## 2020-11-26 MED ORDER — CHLOROPROCAINE HCL 1 % IJ SOLN
INTRAMUSCULAR | Status: DC | PRN
  Administered 2020-11-26: 10 mL

## 2020-11-26 SURGICAL SUPPLY — 21 items
CATH ROBINSON RED A/P 16FR (CATHETERS) ×1 IMPLANT
DECANTER SPIKE VIAL GLASS SM (MISCELLANEOUS) ×3 IMPLANT
FILTER UTR ASPR ASSEMBLY (MISCELLANEOUS) ×3 IMPLANT
GLOVE BIO SURGEON STRL SZ 6.5 (GLOVE) ×2 IMPLANT
GLOVE BIO SURGEONS STRL SZ 6.5 (GLOVE) ×1
GLOVE SURG UNDER POLY LF SZ7 (GLOVE) ×6 IMPLANT
GOWN STRL REUS W/ TWL LRG LVL3 (GOWN DISPOSABLE) ×2 IMPLANT
GOWN STRL REUS W/TWL LRG LVL3 (GOWN DISPOSABLE) ×4
HOSE CONNECTING 18IN BERKELEY (TUBING) ×3 IMPLANT
KIT BERKELEY 1ST TRI 3/8 NO TR (MISCELLANEOUS) ×3 IMPLANT
KIT BERKELEY 1ST TRIMESTER 3/8 (MISCELLANEOUS) ×3 IMPLANT
NS IRRIG 1000ML POUR BTL (IV SOLUTION) ×3 IMPLANT
PACK VAGINAL MINOR WOMEN LF (CUSTOM PROCEDURE TRAY) ×3 IMPLANT
PAD OB MATERNITY 4.3X12.25 (PERSONAL CARE ITEMS) ×3 IMPLANT
SET BERKELEY SUCTION TUBING (SUCTIONS) ×3 IMPLANT
TOWEL GREEN STERILE FF (TOWEL DISPOSABLE) ×6 IMPLANT
UNDERPAD 30X36 HEAVY ABSORB (UNDERPADS AND DIAPERS) ×3 IMPLANT
VACURETTE 10 RIGID CVD (CANNULA) IMPLANT
VACURETTE 7MM CVD STRL WRAP (CANNULA) ×2 IMPLANT
VACURETTE 8 RIGID CVD (CANNULA) IMPLANT
VACURETTE 9 RIGID CVD (CANNULA) IMPLANT

## 2020-11-26 NOTE — Op Note (Addendum)
PREOPERATIVE DIAGNOSES: 1. Missed Abortion 2. Retained products of conception  POSTOPERATIVE DIAGNOSES: Same  PROCEDURE PERFORMED: Dilation, suction, sharp curettage under US guidance  SURGEON: Dr. Belva Agee  ANESTHESIA: Paracervical block and IV sedation  ESTIMATED BLOOD LOSS: minimal  COMPLICATIONS: None  TUBES: None.  DRAINS: None  PATHOLOGY: Endometrial curettings for anora chromosomal analysis  FINDINGS: On exam, under anesthesia, normal appearing vulva and vagina, 7 week sized uterus  Operative findings demonstrated rPOC  Procedure: The patient was taken to the operating room where she was properly prepped and draped in sterile manner under general anesthesia. After bimanual examination, the cervix was exposed with a speculum and the anterior lip of the cervix grasped with a tenaculum. Paracervical block performed. The endocervical canal was then progressively dilated to 48mm under US guidance. Suction catheter was introduced into the uterus under US guidance and to the uterine fundus. The uterus was evacuated under US guidance and good tissue return was noted. NORMAL EMS noted w/out evidence of rPOC. A sharp curettage was then performed until gritty texture noted. All instruments were removed from vagina. The sponge and lap counts were correct times 2 at this time. The patient's procedure was terminated. We then awakened her. She was sent to the Recovery Room in good condition.    Belva Agee MD

## 2020-11-26 NOTE — Anesthesia Procedure Notes (Signed)
Procedure Name: Intubation Date/Time: 11/26/2020 8:58 AM Performed by: Macie Burows, CRNA Pre-anesthesia Checklist: Patient identified, Emergency Drugs available, Suction available and Patient being monitored Patient Re-evaluated:Patient Re-evaluated prior to induction Oxygen Delivery Method: Circle system utilized Preoxygenation: Pre-oxygenation with 100% oxygen Induction Type: IV induction Ventilation: Mask ventilation without difficulty LMA Size: 4.0 Number of attempts: 1 Placement Confirmation: positive ETCO2 and breath sounds checked- equal and bilateral Tube secured with: Tape Dental Injury: Teeth and Oropharynx as per pre-operative assessment

## 2020-11-26 NOTE — Transfer of Care (Signed)
Immediate Anesthesia Transfer of Care Note  Patient: Lori Miranda  Procedure(s) Performed: DILATATION AND EVACUATION WITH ANORA TESTING (N/A Vagina ) OPERATIVE ULTRASOUND (N/A Vagina ) CHROMOSOME STUDIES (N/A Vagina )  Patient Location: PACU  Anesthesia Type:General  Level of Consciousness: awake, alert  and oriented  Airway & Oxygen Therapy: Patient Spontanous Breathing and Patient connected to face mask oxygen  Post-op Assessment: Report given to RN and Post -op Vital signs reviewed and stable  Post vital signs: Reviewed and stable  Last Vitals:  Vitals Value Taken Time  BP 138/84 11/26/20 0933  Temp 36.2 C 11/26/20 0930  Pulse 94 11/26/20 0935  Resp 26 11/26/20 0935  SpO2 100 % 11/26/20 0935  Vitals shown include unvalidated device data.  Last Pain:  Vitals:   11/26/20 0930  TempSrc:   PainSc: 0-No pain         Complications: No complications documented.

## 2020-11-26 NOTE — Progress Notes (Signed)
No updates to above H&P. Patient arrived NPO and was consented in PACU. Risks again discussed, all questions answered, and consent signed. Proceed with D&C. Kizzie Fantasia forms filled out and patient signed them.    Belva Agee MD

## 2020-11-27 ENCOUNTER — Encounter (HOSPITAL_COMMUNITY): Payer: Self-pay | Admitting: Obstetrics and Gynecology

## 2020-11-27 LAB — SURGICAL PATHOLOGY

## 2020-11-27 NOTE — Anesthesia Postprocedure Evaluation (Signed)
Anesthesia Post Note  Patient: CORALINE TALWAR  Procedure(s) Performed: DILATATION AND EVACUATION WITH ANORA TESTING (Vagina ) OPERATIVE ULTRASOUND (Vagina ) CHROMOSOME STUDIES (Vagina )     Patient location during evaluation: PACU Anesthesia Type: General Level of consciousness: awake and alert Pain management: pain level controlled Vital Signs Assessment: post-procedure vital signs reviewed and stable Respiratory status: spontaneous breathing, nonlabored ventilation, respiratory function stable and patient connected to nasal cannula oxygen Cardiovascular status: blood pressure returned to baseline and stable Postop Assessment: no apparent nausea or vomiting Anesthetic complications: no   No notable events documented.  Last Vitals:  Vitals:   11/26/20 0945 11/26/20 1000  BP: 133/75 123/68  Pulse: 76 65  Resp: 16 20  Temp:  (!) 36.3 C  SpO2: 100% 100%    Last Pain:  Vitals:   11/26/20 1000  TempSrc:   PainSc: 1                  Jakeria Caissie P Niley Helbig

## 2021-10-20 LAB — OB RESULTS CONSOLE HEPATITIS B SURFACE ANTIGEN: Hepatitis B Surface Ag: NEGATIVE

## 2021-10-20 LAB — OB RESULTS CONSOLE RUBELLA ANTIBODY, IGM: Rubella: IMMUNE

## 2021-10-20 LAB — OB RESULTS CONSOLE HIV ANTIBODY (ROUTINE TESTING): HIV: NONREACTIVE

## 2021-10-20 LAB — HEPATITIS C ANTIBODY: HCV Ab: NEGATIVE

## 2021-10-20 LAB — OB RESULTS CONSOLE ABO/RH: RH Type: NEGATIVE

## 2021-10-20 LAB — OB RESULTS CONSOLE RPR: RPR: NONREACTIVE

## 2021-10-20 LAB — OB RESULTS CONSOLE ANTIBODY SCREEN: Antibody Screen: NEGATIVE

## 2021-11-03 LAB — OB RESULTS CONSOLE GC/CHLAMYDIA
Chlamydia: NEGATIVE
Neisseria Gonorrhea: NEGATIVE

## 2022-05-05 LAB — OB RESULTS CONSOLE GBS: GBS: NEGATIVE

## 2022-05-26 ENCOUNTER — Inpatient Hospital Stay (EMERGENCY_DEPARTMENT_HOSPITAL)
Admission: AD | Admit: 2022-05-26 | Discharge: 2022-05-26 | Disposition: A | Discharge status: Home / Self Care | Payer: 59 | Source: Home / Self Care | Attending: Obstetrics & Gynecology | Admitting: Obstetrics & Gynecology

## 2022-05-26 ENCOUNTER — Encounter (HOSPITAL_COMMUNITY): Payer: Self-pay | Admitting: *Deleted

## 2022-05-26 ENCOUNTER — Telehealth (HOSPITAL_COMMUNITY): Payer: Self-pay | Admitting: *Deleted

## 2022-05-26 DIAGNOSIS — O479 False labor, unspecified: Secondary | ICD-10-CM

## 2022-05-26 DIAGNOSIS — O471 False labor at or after 37 completed weeks of gestation: Secondary | ICD-10-CM | POA: Insufficient documentation

## 2022-05-26 DIAGNOSIS — Z3A39 39 weeks gestation of pregnancy: Secondary | ICD-10-CM

## 2022-05-26 NOTE — MAU Provider Note (Signed)
S: Lori Miranda is a 31 y.o. G4P1011 at [redacted]w[redacted]d  who presents to MAU today for labor evaluation, due to having had bloody show.     Cervical exam by RN:  Dilation: 3 Effacement (%): 70 Station: -2 Exam by:: Quintella Baton, RN. No change after one hour RN states there is some bloody show but no hemorrhage  Fetal Monitoring: Baseline: 125 Variability: average Accelerations: present Decelerations: absent Contractions: irregular  MDM Discussed patient with RN. NST reviewed.   A: [facility]UP at [redacted]w[redacted]d  False labor  P: Discharge home Labor precautions and kick counts included in AVS Patient to follow-up with office as scheduled  Patient may return to MAU as needed or when in labor   Valora Piccolo 05/26/2022 7:35 AM

## 2022-05-26 NOTE — MAU Note (Signed)
.  Lori Miranda is a 31 y.o. at [redacted]w[redacted]d here in MAU reporting contractions irregularly since last night. Had some spotting last night but awoke at 0430 and went to [facility] and had some small clots. Was checked yesterday with membrane sweep and was 2cm. Reports good FM and denies LOF. For IOL tonight  Onset of complaint: last night Pain score: 2 Vitals:   05/26/22 0606 05/26/22 0608  BP:  128/76  Pulse: 71   Resp: 17   Temp: 98.2 F (36.8 C)   SpO2: 100%      FHT:128 Lab orders placed from triage:  mau labor eval

## 2022-05-26 NOTE — Telephone Encounter (Signed)
Preadmission screen  

## 2022-05-26 NOTE — MAU Note (Signed)
I have communicated with Wynelle Bourgeois, CNM and reviewed vital signs:  Vitals:   05/26/22 0608 05/26/22 0622  BP: 128/76 126/79  Pulse:  71  Resp:    Temp:    SpO2:      Vaginal exam:  Dilation: 3 Effacement (%): 70 Station: -2 Exam by:: Georgina Snell, RN,   Also reviewed contraction pattern and that non-stress test is reactive.  It has been documented that patient is contracting every 3-7 minutes with no cervical change over 1 hour not indicating active labor.  Patient denies any other complaints.  Based on this report provider has given order for discharge.  A discharge order and diagnosis entered by a provider.   Labor discharge instructions reviewed with patient. Patient verbalized understanding of reasons to return to the hospital prior to her scheduled IOL tonight. Patient signed physical copy of AVS; copy placed in medical records bin.

## 2022-05-26 NOTE — H&P (Signed)
Lori Miranda is a 31 y.o. female presenting for IOL. Pregnancy uncomplicated. OB History     Gravida  4   Para  1   Term  1   Preterm      AB  1   Living  1      SAB      IAB      Ectopic  1   Multiple  0   Live Births  1          Past Medical History:  Diagnosis Date   Heart murmur    benign- since child   Ovarian cyst    Pneumonia    as a child   PONV (postoperative nausea and vomiting)    nausea   Past Surgical History:  Procedure Laterality Date   APPENDECTOMY     DIAGNOSTIC LAPAROSCOPY WITH REMOVAL OF ECTOPIC PREGNANCY N/A 04/11/2018   Procedure: DIAGNOSTIC LAPAROSCOPY WITH REMOVAL OF ECTOPIC PREGNANCY;  Surgeon: Ranae Pila, MD;  Location: [facility];  Service: Gynecology;  Laterality: N/A;   DILATION AND EVACUATION N/A 11/26/2020   Procedure: DILATATION AND EVACUATION WITH ANORA TESTING;  Surgeon: Ranae Pila, MD;  Location[facility];  Service: Gynecology;  Laterality: N/A;   OPERATIVE ULTRASOUND N/A 11/26/2020   Procedure: OPERATIVE ULTRASOUND;  Surgeon: Ranae Pila, MD;  Location: [facility] OR;  Service: Gynecology;  Laterality: N/A;   Family History: family history includes Aneurysm in her mother; Diabetes in her mother; Healthy in her father; Hyperlipidemia in her father; Hypertension in her father and mother. Social History:  reports that she has never smoked. She has never used smokeless tobacco. She reports current alcohol use. She reports that she does not use drugs.     Maternal Diabetes: No Genetic Screening: Normal Maternal Ultrasounds/Referrals: Normal Fetal Ultrasounds or other Referrals:  None Maternal Substance Abuse:  No Significant Maternal Medications:  None Significant Maternal Lab Results:  Group B Strep negative Number of Prenatal Visits:greater than 3 verified prenatal visits Other Comments:  None  Review of Systems  Constitutional:  Negative for fever.  Eyes:  Negative for visual disturbance.   Gastrointestinal:  Negative for abdominal pain.  Neurological:  Negative for headaches.   Maternal Medical History:  Fetal activity: Perceived fetal activity is normal.       unknown if currently breastfeeding. Exam Physical Exam Cardiovascular:     Rate and Rhythm: Normal rate.  Pulmonary:     Effort: Pulmonary effort is normal.   Cx 2/40/-2 in office  05/25/22 Prenatal labs: ABO, Rh: A/Negative/-- (05/02 0000) Antibody: Negative (05/02 0000) Rubella: Immune (05/02 0000) RPR: Nonreactive (05/02 0000)  HBsAg: Negative (05/02 0000)  HIV: Non-reactive (05/02 0000)  GBS: Negative/-- (11/15 0000)   Assessment/Plan: 31 yo G4P1 @ 39 6/7 wks for IOL   Lori Miranda II 05/26/2022, 2:25 PM

## 2022-05-27 ENCOUNTER — Other Ambulatory Visit: Payer: Self-pay

## 2022-05-27 ENCOUNTER — Inpatient Hospital Stay (HOSPITAL_COMMUNITY): Payer: 59 | Admitting: Anesthesiology

## 2022-05-27 ENCOUNTER — Inpatient Hospital Stay (HOSPITAL_COMMUNITY): Payer: 59

## 2022-05-27 ENCOUNTER — Encounter (HOSPITAL_COMMUNITY): Payer: Self-pay | Admitting: Obstetrics and Gynecology

## 2022-05-27 ENCOUNTER — Inpatient Hospital Stay (HOSPITAL_COMMUNITY)
Admission: RE | Admit: 2022-05-27 | Discharge: 2022-05-28 | DRG: 807 | Disposition: A | Discharge status: Home / Self Care | Payer: 59 | Attending: Obstetrics and Gynecology | Admitting: Obstetrics and Gynecology

## 2022-05-27 DIAGNOSIS — O26893 Other specified pregnancy related conditions, third trimester: Principal | ICD-10-CM | POA: Diagnosis present

## 2022-05-27 DIAGNOSIS — Z3A39 39 weeks gestation of pregnancy: Secondary | ICD-10-CM

## 2022-05-27 DIAGNOSIS — Z3490 Encounter for supervision of normal pregnancy, unspecified, unspecified trimester: Principal | ICD-10-CM

## 2022-05-27 LAB — CBC
HCT: 37.3 % (ref 36.0–46.0)
Hemoglobin: 12.6 g/dL (ref 12.0–15.0)
MCH: 31.7 pg (ref 26.0–34.0)
MCHC: 33.8 g/dL (ref 30.0–36.0)
MCV: 94 fL (ref 80.0–100.0)
Platelets: 171 10*3/uL (ref 150–400)
RBC: 3.97 MIL/uL (ref 3.87–5.11)
RDW: 12.7 % (ref 11.5–15.5)
WBC: 9.8 10*3/uL (ref 4.0–10.5)
nRBC: 0 % (ref 0.0–0.2)

## 2022-05-27 LAB — RPR: RPR Ser Ql: NONREACTIVE

## 2022-05-27 LAB — TYPE AND SCREEN
ABO/RH(D): A NEG
Antibody Screen: POSITIVE

## 2022-05-27 LAB — HIV ANTIBODY (ROUTINE TESTING W REFLEX): HIV Screen 4th Generation wRfx: NONREACTIVE

## 2022-05-27 MED ORDER — OXYCODONE-ACETAMINOPHEN 5-325 MG PO TABS: 1.0000 | ORAL_TABLET | ORAL | Status: DC | PRN

## 2022-05-27 MED ORDER — PRENATAL MULTIVITAMIN CH
1.0000 | ORAL_TABLET | Freq: Every day | ORAL | Status: DC
  Filled 2022-05-27: qty 1

## 2022-05-27 MED ORDER — DIPHENHYDRAMINE HCL 50 MG/ML IJ SOLN: 12.5000 mg | INTRAMUSCULAR | Status: DC | PRN

## 2022-05-27 MED ORDER — IBUPROFEN 600 MG PO TABS
600.0000 mg | ORAL_TABLET | Freq: Four times a day (QID) | ORAL | Status: DC
  Administered 2022-05-28 (×2): 600 mg via ORAL
  Filled 2022-05-27 (×4): qty 1

## 2022-05-27 MED ORDER — EPHEDRINE 5 MG/ML INJ: 10.0000 mg | INTRAVENOUS | Status: DC | PRN

## 2022-05-27 MED ORDER — OXYTOCIN-SODIUM CHLORIDE 30-0.9 UT/500ML-% IV SOLN: 1.0000 m[IU]/min | INTRAVENOUS | Status: DC

## 2022-05-27 MED ORDER — LIDOCAINE-EPINEPHRINE (PF) 2 %-1:200000 IJ SOLN
INTRAMUSCULAR | Status: DC | PRN
  Administered 2022-05-27: 5 mL via EPIDURAL

## 2022-05-27 MED ORDER — DIBUCAINE (PERIANAL) 1 % EX OINT: 1.0000 | TOPICAL_OINTMENT | CUTANEOUS | Status: DC | PRN

## 2022-05-27 MED ORDER — SOD CITRATE-CITRIC ACID 500-334 MG/5ML PO SOLN: 30.0000 mL | ORAL | Status: DC | PRN

## 2022-05-27 MED ORDER — ONDANSETRON HCL 4 MG PO TABS: 4.0000 mg | ORAL_TABLET | ORAL | Status: DC | PRN

## 2022-05-27 MED ORDER — FENTANYL-BUPIVACAINE-NACL 0.5-0.125-0.9 MG/250ML-% EP SOLN
12.0000 mL/h | EPIDURAL | Status: DC | PRN
  Administered 2022-05-27: 12 mL/h via EPIDURAL
  Filled 2022-05-27: qty 250

## 2022-05-27 MED ORDER — BENZOCAINE-MENTHOL 20-0.5 % EX AERO: 1.0000 | INHALATION_SPRAY | CUTANEOUS | Status: DC | PRN

## 2022-05-27 MED ORDER — TERBUTALINE SULFATE 1 MG/ML IJ SOLN: 0.2500 mg | Freq: Once | INTRAMUSCULAR | Status: DC | PRN

## 2022-05-27 MED ORDER — OXYCODONE-ACETAMINOPHEN 5-325 MG PO TABS: 2.0000 | ORAL_TABLET | ORAL | Status: DC | PRN

## 2022-05-27 MED ORDER — ONDANSETRON HCL 4 MG/2ML IJ SOLN: 4.0000 mg | INTRAMUSCULAR | Status: DC | PRN

## 2022-05-27 MED ORDER — OXYCODONE HCL 5 MG PO TABS: 10.0000 mg | ORAL_TABLET | ORAL | Status: DC | PRN

## 2022-05-27 MED ORDER — ONDANSETRON HCL 4 MG/2ML IJ SOLN: 4.0000 mg | Freq: Four times a day (QID) | INTRAMUSCULAR | Status: DC | PRN

## 2022-05-27 MED ORDER — LACTATED RINGERS IV SOLN
500.0000 mL | Freq: Once | INTRAVENOUS | Status: AC
  Administered 2022-05-27: 500 mL via INTRAVENOUS

## 2022-05-27 MED ORDER — FLEET ENEMA 7-19 GM/118ML RE ENEM: 1.0000 | ENEMA | RECTAL | Status: DC | PRN

## 2022-05-27 MED ORDER — PHENYLEPHRINE 80 MCG/ML (10ML) SYRINGE FOR IV PUSH (FOR BLOOD PRESSURE SUPPORT): 80.0000 ug | PREFILLED_SYRINGE | INTRAVENOUS | Status: DC | PRN

## 2022-05-27 MED ORDER — ACETAMINOPHEN 325 MG PO TABS
650.0000 mg | ORAL_TABLET | ORAL | Status: DC | PRN
  Administered 2022-05-27 (×2): 650 mg via ORAL
  Filled 2022-05-27 (×2): qty 2

## 2022-05-27 MED ORDER — OXYTOCIN-SODIUM CHLORIDE 30-0.9 UT/500ML-% IV SOLN
1.0000 m[IU]/min | INTRAVENOUS | Status: DC
  Administered 2022-05-27: 1 m[IU]/min via INTRAVENOUS

## 2022-05-27 MED ORDER — TETANUS-DIPHTH-ACELL PERTUSSIS 5-2.5-18.5 LF-MCG/0.5 IM SUSY: 0.5000 mL | PREFILLED_SYRINGE | Freq: Once | INTRAMUSCULAR | Status: DC

## 2022-05-27 MED ORDER — ACETAMINOPHEN 325 MG PO TABS: 650.0000 mg | ORAL_TABLET | ORAL | Status: DC | PRN

## 2022-05-27 MED ORDER — OXYTOCIN BOLUS FROM INFUSION
333.0000 mL | Freq: Once | INTRAVENOUS | Status: AC
  Administered 2022-05-27: 333 mL via INTRAVENOUS

## 2022-05-27 MED ORDER — OXYTOCIN-SODIUM CHLORIDE 30-0.9 UT/500ML-% IV SOLN
2.5000 [IU]/h | INTRAVENOUS | Status: DC
  Administered 2022-05-27 (×2): 2.5 [IU]/h via INTRAVENOUS
  Filled 2022-05-27: qty 500

## 2022-05-27 MED ORDER — LIDOCAINE HCL (PF) 1 % IJ SOLN: 30.0000 mL | INTRAMUSCULAR | Status: DC | PRN

## 2022-05-27 MED ORDER — COCONUT OIL OIL: 1.0000 | TOPICAL_OIL | Status: DC | PRN

## 2022-05-27 MED ORDER — LACTATED RINGERS IV SOLN
500.0000 mL | INTRAVENOUS | Status: DC | PRN
  Administered 2022-05-27: 500 mL via INTRAVENOUS

## 2022-05-27 MED ORDER — LACTATED RINGERS IV SOLN: INTRAVENOUS | Status: DC

## 2022-05-27 MED ORDER — WITCH HAZEL-GLYCERIN EX PADS: 1.0000 | MEDICATED_PAD | CUTANEOUS | Status: DC | PRN

## 2022-05-27 MED ORDER — ZOLPIDEM TARTRATE 5 MG PO TABS: 5.0000 mg | ORAL_TABLET | Freq: Every evening | ORAL | Status: DC | PRN

## 2022-05-27 MED ORDER — OXYCODONE HCL 5 MG PO TABS: 5.0000 mg | ORAL_TABLET | ORAL | Status: DC | PRN

## 2022-05-27 MED ORDER — SIMETHICONE 80 MG PO CHEW: 80.0000 mg | CHEWABLE_TABLET | ORAL | Status: DC | PRN

## 2022-05-27 MED ORDER — SENNOSIDES-DOCUSATE SODIUM 8.6-50 MG PO TABS
2.0000 | ORAL_TABLET | ORAL | Status: DC
  Filled 2022-05-27 (×2): qty 2

## 2022-05-27 MED ORDER — FENTANYL CITRATE (PF) 100 MCG/2ML IJ SOLN
50.0000 ug | INTRAMUSCULAR | Status: DC | PRN
  Administered 2022-05-27: 50 ug via INTRAVENOUS
  Filled 2022-05-27: qty 2

## 2022-05-27 MED ORDER — DIPHENHYDRAMINE HCL 25 MG PO CAPS: 25.0000 mg | ORAL_CAPSULE | Freq: Four times a day (QID) | ORAL | Status: DC | PRN

## 2022-05-27 NOTE — Progress Notes (Signed)
Delivery Note At 8:50 AM a viable female was delivered via Vaginal, Spontaneous (Presentation:   Occiput Anterior).  APGAR: , ; weight  .   Placenta status: intact ,  .  Cord: 3 vessels with the following complications: None.  Cord pH:   Anesthesia: Epidural Episiotomy: None Lacerations:  first degree left periurethral, not bleeding, not repaired Suture Repair:  Est. Blood Loss (mL):  100  Mom to postpartum.  Baby to Couplet care / Skin to Skin.  Roselle Locus II 05/27/2022, 8:57 AM

## 2022-05-27 NOTE — Lactation Note (Signed)
This note was copied from a baby's chart. Lactation Consultation Note  Patient Name: Lori Miranda [id number] Date: 05/27/2022 Reason for consult: Initial assessment;Term;Breastfeeding assistance Age:33 hours  LC entered the room and the birth parent was preparing to breastfeed the infant.  Per the birth parent, the infant had not been breastfeeding very much.  She stated that the infant was having some emesis.  The birth parent latched the infant to the left breast in the cross-cradle hold.  The infant latched deeply with lips flanged, sucking was rhythmic, swallows were noted.  The birth parent denied any pain.  The birth parent asked questions about introducing a bottle and a pacifier.  All questions were answered.   Infant Feeding Plan:  Breastfeed 8+ times in 24 hours according to feeding cues.  Hand express for stimulation and spoon feed the infant EBM.  Call RN/LC for breastfeeding assistance.   Maternal Data Has patient been taught Hand Expression?: Yes Does the patient have breastfeeding experience prior to this delivery?: Yes How long did the patient breastfeed?: 2 years  Feeding    LATCH Score Latch: Grasps breast easily, tongue down, lips flanged, rhythmical sucking.  Audible Swallowing: Spontaneous and intermittent  Type of Nipple: Everted at rest and after stimulation  Comfort (Breast/Nipple): Soft / non-tender  Hold (Positioning): No assistance needed to correctly position infant at breast.  LATCH Score: 10   Lactation Tools Discussed/Used    Interventions Interventions: Education;LC Services brochure  Discharge Pump: Personal;DEBP  Consult Status Consult Status: Follow-up Date: 05/28/22 Follow-up type: In-patient    Orvil Feil Yassin Scales 05/27/2022, 10:04 PM

## 2022-05-27 NOTE — Anesthesia Preprocedure Evaluation (Signed)
Anesthesia Evaluation  Patient identified by MRN, date of birth, ID band Patient awake    Reviewed: Allergy & Precautions, NPO status , Patient's Chart, lab work & pertinent test results  History of Anesthesia Complications (+) PONV and history of anesthetic complications  Airway Mallampati: II  TM Distance: >3 FB Neck ROM: Full    Dental no notable dental hx.    Pulmonary neg pulmonary ROS   Pulmonary exam normal breath sounds clear to auscultation       Cardiovascular negative cardio ROS Normal cardiovascular exam Rhythm:Regular Rate:Normal     Neuro/Psych negative neurological ROS  negative psych ROS   GI/Hepatic negative GI ROS, Neg liver ROS,,,  Endo/Other  negative endocrine ROS    Renal/GU negative Renal ROS  negative genitourinary   Musculoskeletal negative musculoskeletal ROS (+)    Abdominal   Peds  Hematology negative hematology ROS (+)   Anesthesia Other Findings Elective IOL  Reproductive/Obstetrics (+) Pregnancy                             Anesthesia Physical Anesthesia Plan  ASA: 2  Anesthesia Plan: Epidural   Post-op Pain Management:    Induction:   PONV Risk Score and Plan: Treatment may vary due to age or medical condition  Airway Management Planned: Natural Airway  Additional Equipment:   Intra-op Plan:   Post-operative Plan:   Informed Consent: I have reviewed the patients History and Physical, chart, labs and discussed the procedure including the risks, benefits and alternatives for the proposed anesthesia with the patient or authorized representative who has indicated his/her understanding and acceptance.       Plan Discussed with: Anesthesiologist  Anesthesia Plan Comments: (Patient identified. Risks, benefits, options discussed with patient including but not limited to bleeding, infection, nerve damage, paralysis, failed block, incomplete pain  control, headache, blood pressure changes, nausea, vomiting, reactions to medication, itching, and post partum back pain. Confirmed with bedside nurse the patient's most recent platelet count. Confirmed with the patient that they are not taking any anticoagulation, have any bleeding history or any family history of bleeding disorders. Patient expressed understanding and wishes to proceed. All questions were answered. )       Anesthesia Quick Evaluation

## 2022-05-27 NOTE — Anesthesia Procedure Notes (Signed)
Epidural Patient location during procedure: OB Start time: 05/27/2022 7:55 AM End time: 05/27/2022 8:05 AM  Staffing Anesthesiologist: Elmer Picker, MD Performed: anesthesiologist   Preanesthetic Checklist Completed: patient identified, IV checked, risks and benefits discussed, monitors and equipment checked, pre-op evaluation and timeout performed  Epidural Patient position: sitting Prep: DuraPrep and site prepped and draped Patient monitoring: continuous pulse ox, blood pressure, heart rate and cardiac monitor Approach: midline Location: L3-L4 Injection technique: LOR air  Needle:  Needle type: Tuohy  Needle gauge: 17 G Needle length: 9 cm Needle insertion depth: 5 cm Catheter type: closed end flexible Catheter size: 19 Gauge Catheter at skin depth: 10 cm Test dose: negative  Assessment Sensory level: T8 Events: blood not aspirated, no cerebrospinal fluid, injection not painful, no injection resistance, no paresthesia and negative IV test  Additional Notes Patient identified. Risks/Benefits/Options discussed with patient including but not limited to bleeding, infection, nerve damage, paralysis, failed block, incomplete pain control, headache, blood pressure changes, nausea, vomiting, reactions to medication both or allergic, itching and postpartum back pain. Confirmed with bedside nurse the patient's most recent platelet count. Confirmed with patient that they are not currently taking any anticoagulation, have any bleeding history or any family history of bleeding disorders. Patient expressed understanding and wished to proceed. All questions were answered. Sterile technique was used throughout the entire procedure. Please see nursing notes for vital signs. Test dose was given through epidural catheter and negative prior to continuing to dose epidural or start infusion. Warning signs of high block given to the patient including shortness of breath, tingling/numbness in hands,  complete motor block, or any concerning symptoms with instructions to call for help. Patient was given instructions on fall risk and not to get out of bed. All questions and concerns addressed with instructions to call with any issues or inadequate analgesia.  Reason for block:procedure for pain

## 2022-05-27 NOTE — Progress Notes (Signed)
About 7:30 am Cx 6/90/-2/vtx/BBOW AROM>clear FHT cat one

## 2022-05-28 LAB — CBC
HCT: 36.8 % (ref 36.0–46.0)
Hemoglobin: 12.3 g/dL (ref 12.0–15.0)
MCH: 31.3 pg (ref 26.0–34.0)
MCHC: 33.4 g/dL (ref 30.0–36.0)
MCV: 93.6 fL (ref 80.0–100.0)
Platelets: 171 10*3/uL (ref 150–400)
RBC: 3.93 MIL/uL (ref 3.87–5.11)
RDW: 12.8 % (ref 11.5–15.5)
WBC: 7.6 10*3/uL (ref 4.0–10.5)
nRBC: 0 % (ref 0.0–0.2)

## 2022-05-28 MED ORDER — IBUPROFEN 600 MG PO TABS: 600.0000 mg | ORAL_TABLET | Freq: Four times a day (QID) | ORAL | 0 refills | Status: DC

## 2022-05-28 MED ORDER — ACETAMINOPHEN 325 MG PO TABS: 650.0000 mg | ORAL_TABLET | ORAL | 0 refills | Status: DC | PRN

## 2022-05-28 NOTE — Discharge Summary (Signed)
Obstetric Discharge Summary  Lori Miranda is a 31 y.o. female that presented on 05/27/2022 for induction of labor.  Her labor course was uncomplicated and she delivered a viable female infant on 05/27/2022.  Her postpartum course was uncomplicated and on PPD#1, she reported well controlled pain, spontaneous voiding, ambulating without difficulty, and tolerating PO.  She was stable for discharge home on 05/28/2022 with plans for in-office follow up.  Hemoglobin  Date Value Ref Range Status  05/28/2022 12.3 12.0 - 15.0 g/dL Final   HCT  Date Value Ref Range Status  05/28/2022 36.8 36.0 - 46.0 % Final    Physical Exam:  General: alert and no distress Lochia: appropriate Uterine Fundus: firm DVT Evaluation: No evidence of DVT seen on physical exam.  Discharge Diagnoses: Term Pregnancy-delivered  Discharge Information: Date: 05/28/2022 Activity: Pelvic rest, as tolerated Diet: routine Medications: Tylenol, motrin Condition: stable Instructions: Refer to practice specific booklet.  Discussed prior to discharge.  Discharge to: Home  Follow-up Information     [place] Follow up.   Why: Please follow up for a 6 week postpartum visit. Contact information: [address] [place] [address] [phone]                 Newborn Data: Live born female  Birth Weight: 6 lb 8.1 oz (2950 g) APGAR: 9, 9  Newborn Delivery   Birth date/time: 05/27/2022 08:50:00 Delivery type: Vaginal, Spontaneous      Home with mother.  Lori Miranda 05/28/2022, 9:36 PM

## 2022-05-28 NOTE — Anesthesia Postprocedure Evaluation (Signed)
Anesthesia Post Note  Patient: Lori Miranda  Procedure(s) Performed: AN AD HOC LABOR EPIDURAL     Patient location during evaluation: Mother Baby Anesthesia Type: Epidural Level of consciousness: awake, oriented and awake and alert Pain management: pain level controlled Vital Signs Assessment: post-procedure vital signs reviewed and stable Respiratory status: spontaneous breathing, respiratory function stable and nonlabored ventilation Cardiovascular status: stable Postop Assessment: adequate PO intake, able to ambulate, patient able to bend at knees, no apparent nausea or vomiting and no headache Anesthetic complications: no   No notable events documented.  Last Vitals:  Vitals:   05/27/22 2307 05/28/22 0507  BP: 122/78 126/74  Pulse: (!) 50 (!) 53  Resp: 16 17  Temp: 36.7 C 36.6 C  SpO2:      Last Pain:  Vitals:   05/28/22 0507  TempSrc: Oral  PainSc:    Pain Goal:                   [name]

## 2022-05-28 NOTE — Progress Notes (Signed)
Postpartum Progress Note  Post Partum Day 1 s/p spontaneous vaginal delivery.  Patient reports well-controlled pain, ambulating without difficulty, voiding spontaneously, tolerating PO.  Vaginal bleeding is appropriate.   Objective: Blood pressure 126/74, pulse (!) 53, temperature 97.9 F (36.6 C), temperature source Oral, resp. rate 17, height 5\' 3"  (1.6 m), weight 63.2 kg, SpO2 99 %, unknown if currently breastfeeding.  Physical Exam:  General: alert no distress Lochia: appropriate Uterine Fundus: firm DVT Evaluation: No evidence of DVT seen on physical exam.  Recent Labs    05/27/22 0022 05/28/22 0452  HGB 12.6 12.3  HCT 37.3 36.8    Assessment/Plan: Postpartum Day 1, s/p vaginal delivery. Continue routine postpartum care Lactation following Anticipate discharge home today   LOS: 1 day   14/08/23 05/28/2022, 7:37 AM

## 2022-06-04 ENCOUNTER — Telehealth (HOSPITAL_COMMUNITY): Payer: Self-pay | Admitting: *Deleted

## 2022-06-04 NOTE — Telephone Encounter (Signed)
Left phone voicemail message.  Duffy Rhody, RN 06-04-2022 at 10:55am

## 2023-04-05 IMAGING — US US OB < 14 WEEKS - US OB TV
1 series · 15 of 28 positions shown · non-contrast
Comparison: None.

CLINICAL DATA: Pelvic pain

EXAM:
OBSTETRIC <14 WK US AND TRANSVAGINAL OB US
TECHNIQUE: Both transabdominal and transvaginal ultrasound examinations were
performed for complete evaluation of the gestation as well as the
maternal uterus, adnexal regions, and pelvic cul-de-sac.
Transvaginal technique was performed to assess early pregnancy.

[Series 1: us ob < 14 weeks - us ob tv · 15 of 52 slices shown]
[im 1/52]
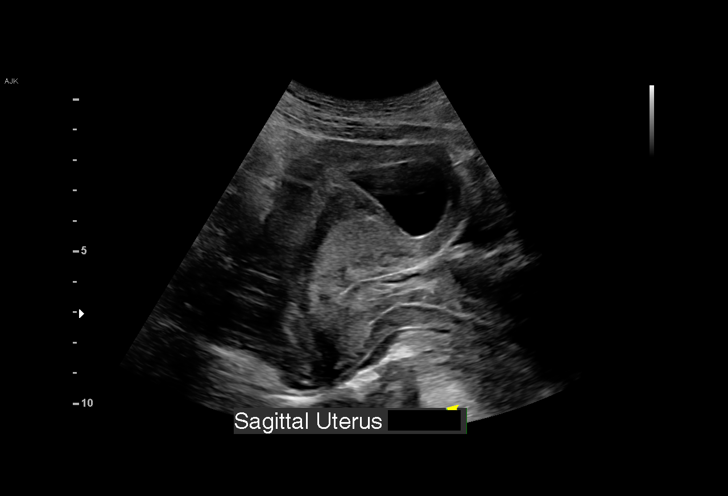
[im 4/52]
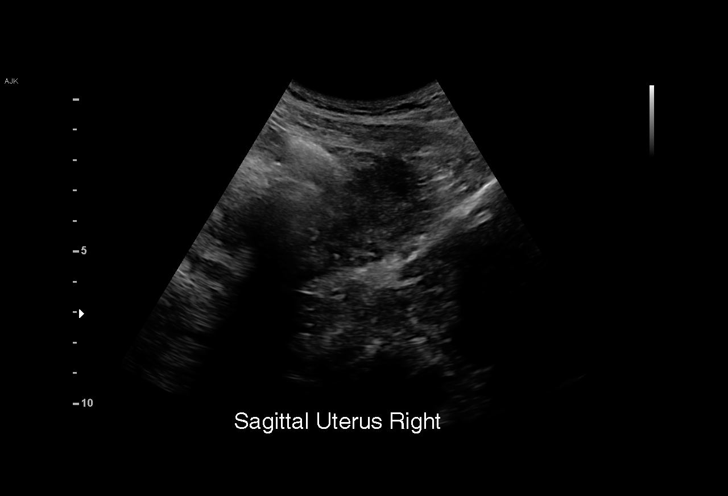
[im 8/52]
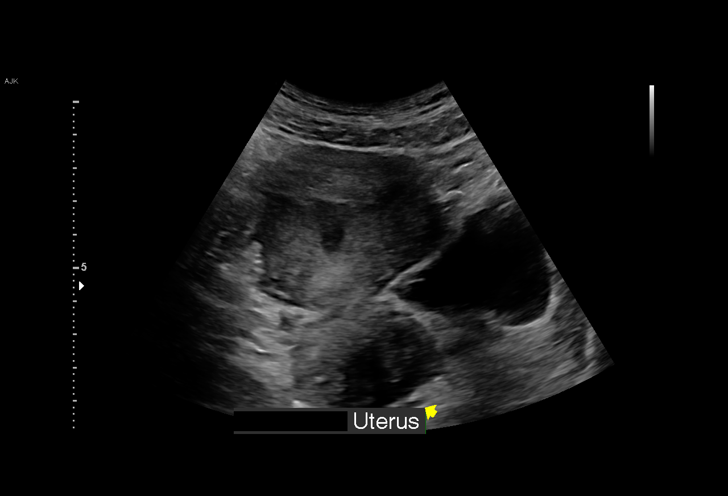
[im 12/52]
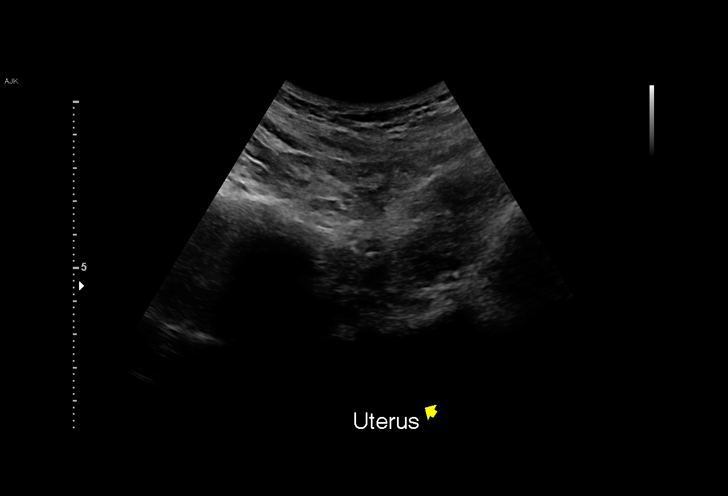
[im 16/52]
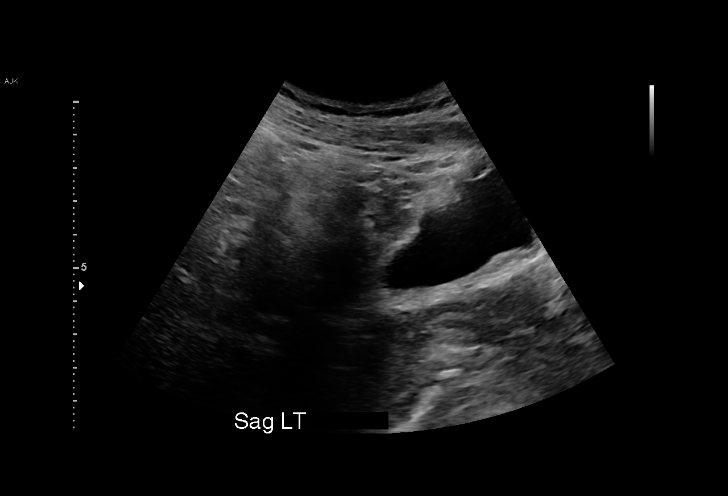
[im 19/52]
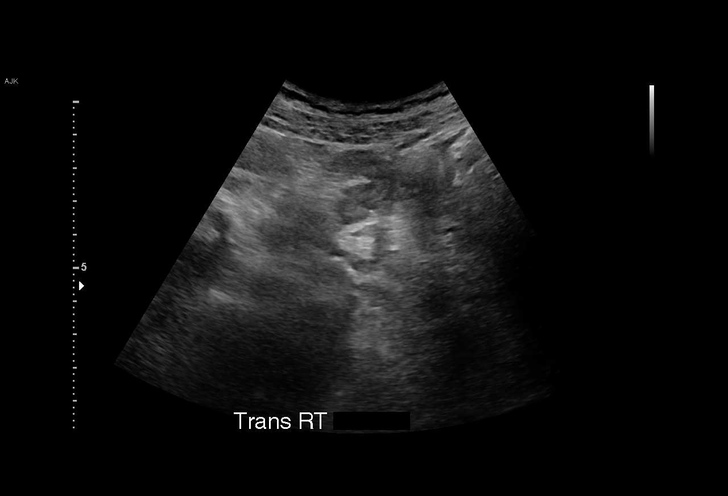
[im 23/52]
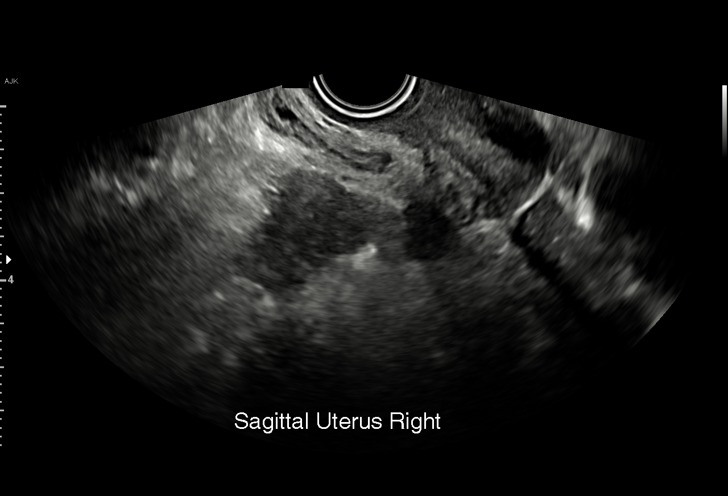
[im 27/52]
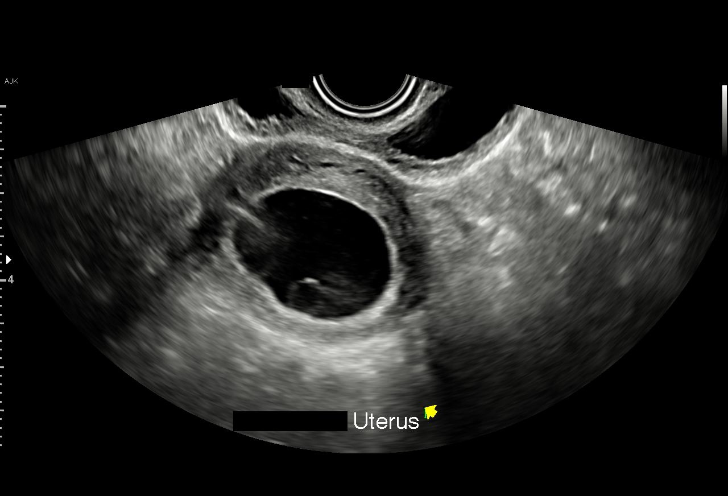
[im 29/52]
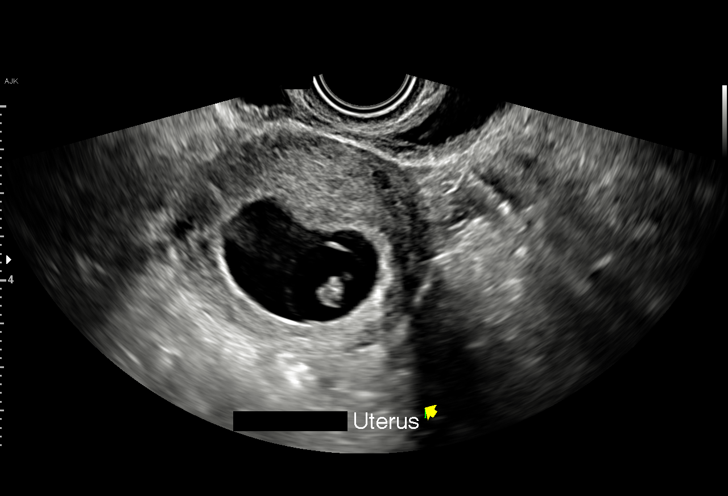
[im 33/52]
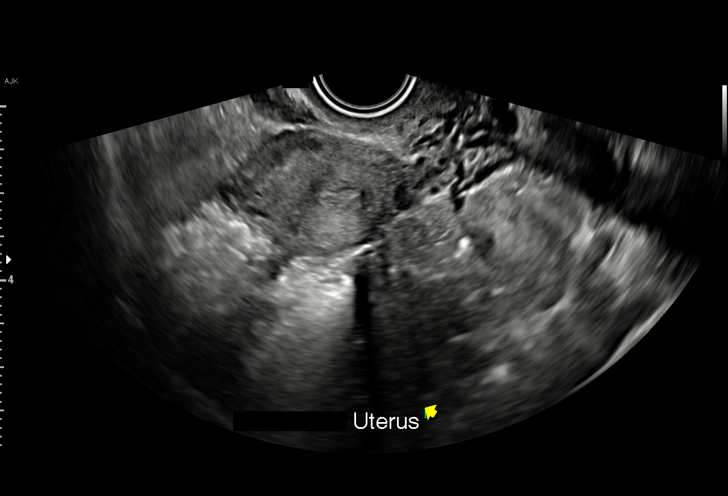
[im 36/52]
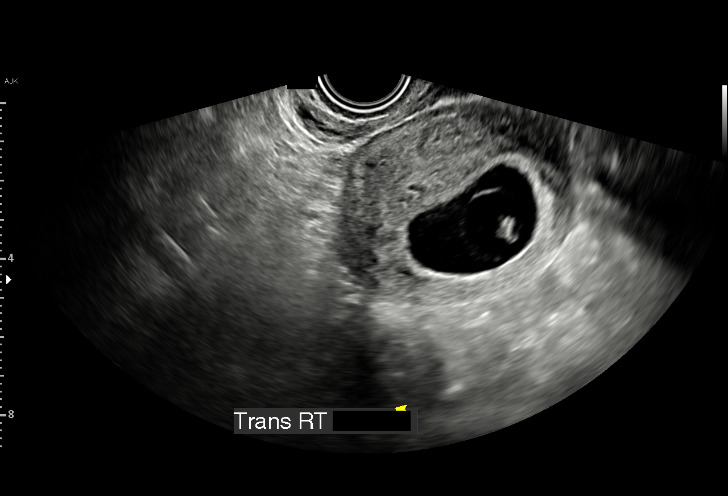
[im 40/52]
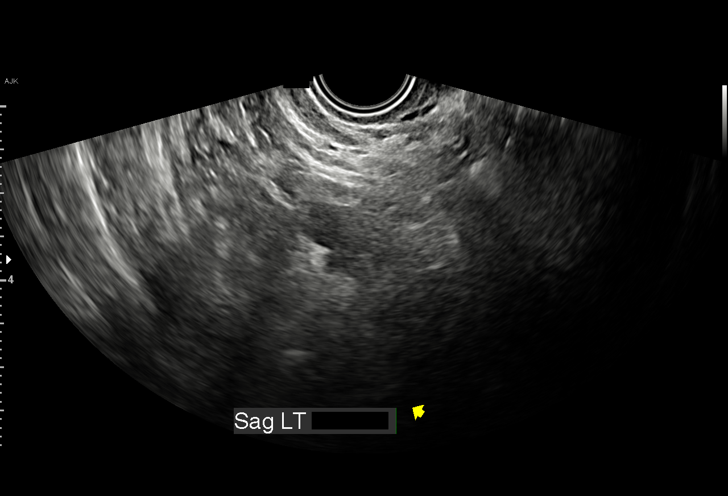
[im 44/52]
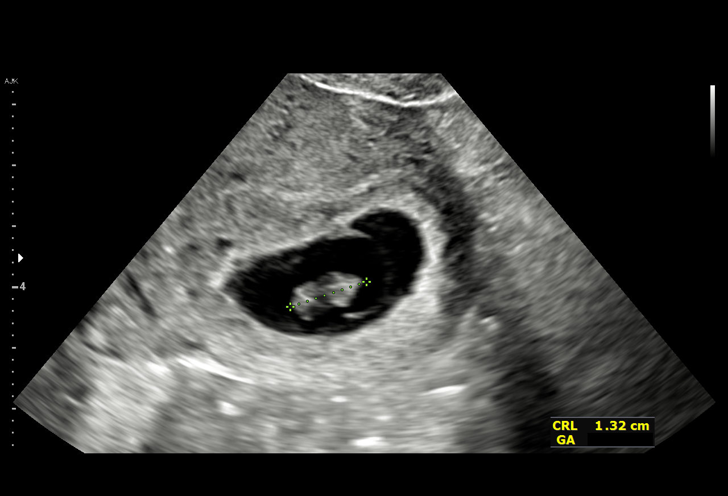
[im 48/52]
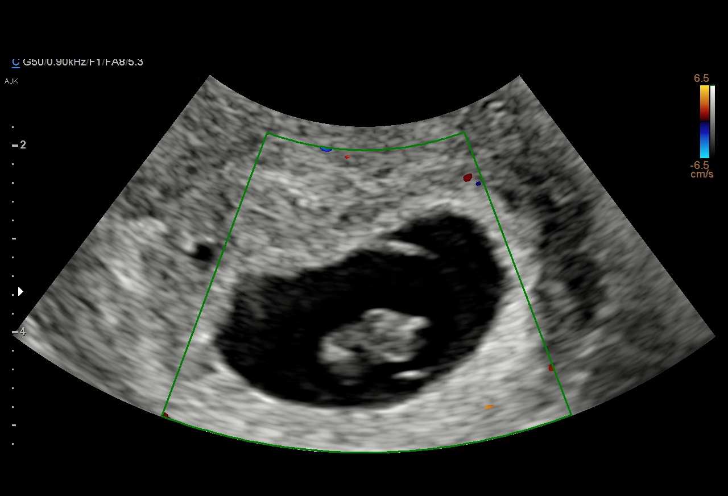
[im 52/52]
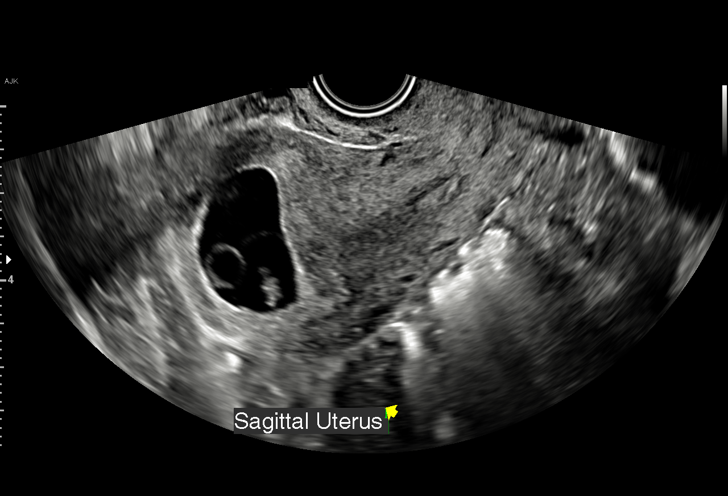

[15 of 28 positions shown; findings below may reference images not displayed]

FINDINGS: Intrauterine gestational sac: Present

Yolk sac:  Present

Embryo:  Present

Cardiac Activity: Absent

CRL:  13.6 mm   7 w   4 d

Subchorionic hemorrhage:  None visualized.

Maternal uterus/adnexae: Right ovary is not well visualized. Left
ovary has been surgically removed during ectopic pregnancy surgery.
No free fluid is noted.
IMPRESSION: Single intrauterine gestation with absent fetal heartbeat consistent
with intrauterine demise.

## 2023-07-06 ENCOUNTER — Ambulatory Visit: Payer: 59

## 2023-07-08 ENCOUNTER — Ambulatory Visit: Payer: 59

## 2023-07-12 ENCOUNTER — Ambulatory Visit: Payer: 59

## 2023-08-08 ENCOUNTER — Telehealth: Payer: 59 | Admitting: Physician Assistant

## 2023-08-08 DIAGNOSIS — J019 Acute sinusitis, unspecified: Secondary | ICD-10-CM | POA: Diagnosis not present

## 2023-08-08 DIAGNOSIS — B9689 Other specified bacterial agents as the cause of diseases classified elsewhere: Secondary | ICD-10-CM | POA: Diagnosis not present

## 2023-08-08 MED ORDER — AMOXICILLIN-POT CLAVULANATE 875-125 MG PO TABS: 1.0000 | ORAL_TABLET | Freq: Two times a day (BID) | ORAL | 0 refills | Status: DC

## 2023-08-08 NOTE — Progress Notes (Signed)

## 2023-08-11 ENCOUNTER — Ambulatory Visit: Payer: 59 | Admitting: Urgent Care

## 2023-08-25 ENCOUNTER — Ambulatory Visit: Payer: 59 | Admitting: Urgent Care

## 2023-09-13 ENCOUNTER — Telehealth: Admitting: Physician Assistant

## 2023-09-13 DIAGNOSIS — R0981 Nasal congestion: Secondary | ICD-10-CM

## 2023-09-13 NOTE — Progress Notes (Signed)
  Because you have had recurrent symptoms, question a possible obstruction (like a polyp), and have failed a first-line treatment, I feel your condition warrants further evaluation and I recommend that you be seen in a face-to-face visit.   NOTE: There will be NO CHARGE for this E-Visit   If you are having a true medical emergency, please call 911.     For an urgent face to face visit, [facility] has multiple urgent care centers for your convenience.  Click the link below for the full list of locations and hours, walk-in wait times, appointment scheduling options and driving directions:  Urgent Care - [place], [place], [place], [place], [place], [place]  [place]     Your MyChart E-visit questionnaire answers were reviewed by a board certified advanced clinical practitioner to complete your personal care plan based on your specific symptoms.    Thank you for using e-Visits.    I have spent 5 minutes in review of e-visit questionnaire, review and updating patient chart, medical decision making and response to patient.   Margaretann Loveless, PA-C

## 2023-09-15 ENCOUNTER — Ambulatory Visit: Payer: 59 | Admitting: Urgent Care

## 2023-09-22 ENCOUNTER — Ambulatory Visit: Admitting: Urgent Care
